# Patient Record
Sex: Male | Born: 1960 | Race: Black or African American | Hispanic: No | Smoking: Never smoker
Health system: Southern US, Community
[De-identification: ages and names within clinical notes are randomized; demographics above are authoritative.]

## PROBLEM LIST (undated history)

## (undated) DIAGNOSIS — E785 Hyperlipidemia, unspecified: Secondary | ICD-10-CM

## (undated) DIAGNOSIS — H409 Unspecified glaucoma: Secondary | ICD-10-CM

## (undated) DIAGNOSIS — I10 Essential (primary) hypertension: Secondary | ICD-10-CM

## (undated) DIAGNOSIS — Z86010 Personal history of colonic polyps: Secondary | ICD-10-CM

## (undated) DIAGNOSIS — E119 Type 2 diabetes mellitus without complications: Secondary | ICD-10-CM

## (undated) DIAGNOSIS — E669 Obesity, unspecified: Secondary | ICD-10-CM

## (undated) DIAGNOSIS — Z860101 Personal history of adenomatous and serrated colon polyps: Secondary | ICD-10-CM

## (undated) HISTORY — DX: Obesity, unspecified: E66.9

## (undated) HISTORY — DX: Personal history of adenomatous and serrated colon polyps: Z86.0101

## (undated) HISTORY — PX: LUMBAR DISC SURGERY: SHX700

## (undated) HISTORY — DX: Hyperlipidemia, unspecified: E78.5

## (undated) HISTORY — DX: Personal history of colonic polyps: Z86.010

## (undated) HISTORY — DX: Type 2 diabetes mellitus without complications: E11.9

## (undated) HISTORY — PX: COLONOSCOPY: SHX174

## (undated) HISTORY — DX: Essential (primary) hypertension: I10

## (undated) HISTORY — DX: Unspecified glaucoma: H40.9

---

## 2009-02-25 ENCOUNTER — Emergency Department: Payer: Self-pay | Admitting: Emergency Medicine

## 2009-03-09 ENCOUNTER — Encounter: Admission: RE | Admit: 2009-03-09 | Discharge: 2009-03-09 | Payer: Self-pay | Admitting: Orthopedic Surgery

## 2009-03-18 ENCOUNTER — Ambulatory Visit (HOSPITAL_COMMUNITY): Admission: RE | Admit: 2009-03-18 | Discharge: 2009-03-19 | Payer: Self-pay | Admitting: Neurosurgery

## 2010-01-07 ENCOUNTER — Ambulatory Visit: Payer: Self-pay | Admitting: Internal Medicine

## 2010-02-02 ENCOUNTER — Inpatient Hospital Stay: Payer: Self-pay

## 2010-02-07 ENCOUNTER — Other Ambulatory Visit: Payer: Self-pay | Admitting: Internal Medicine

## 2010-02-12 ENCOUNTER — Ambulatory Visit: Payer: Self-pay | Admitting: Internal Medicine

## 2010-02-28 ENCOUNTER — Encounter: Payer: Self-pay | Admitting: Orthopedic Surgery

## 2010-03-01 ENCOUNTER — Ambulatory Visit: Payer: Self-pay

## 2010-03-10 ENCOUNTER — Ambulatory Visit: Payer: Self-pay | Admitting: Internal Medicine

## 2010-03-10 ENCOUNTER — Ambulatory Visit: Payer: Self-pay

## 2010-04-08 ENCOUNTER — Ambulatory Visit: Payer: Self-pay

## 2010-04-29 LAB — BASIC METABOLIC PANEL
BUN: 7 mg/dL (ref 6–23)
CO2: 31 mEq/L (ref 19–32)
Calcium: 9.7 mg/dL (ref 8.4–10.5)
Creatinine, Ser: 0.9 mg/dL (ref 0.4–1.5)
GFR calc non Af Amer: >60 mL/min (ref >60)
Glucose, Bld: 147 mg/dL — ABNORMAL HIGH (ref 70–99)

## 2010-04-29 LAB — URINALYSIS, ROUTINE W REFLEX MICROSCOPIC
Hgb urine dipstick: NEGATIVE
Nitrite: NEGATIVE
Protein, ur: NEGATIVE mg/dL
Urobilinogen, UA: 0.2 mg/dL (ref 0.0–1.0)

## 2010-04-29 LAB — CBC
Hemoglobin: 17.1 g/dL — ABNORMAL HIGH (ref 13.0–17.0)
MCHC: 34.9 g/dL (ref 30.0–36.0)
Platelets: 124 10*3/uL — ABNORMAL LOW (ref 150–400)
RDW: 13.6 % (ref 11.5–15.5)

## 2010-04-29 LAB — URINE MICROSCOPIC-ADD ON

## 2010-05-09 ENCOUNTER — Ambulatory Visit: Payer: Self-pay

## 2011-01-11 IMAGING — CR DG CHEST 1V PORT
1 series · 1 of 1 positions shown · non-contrast
Comparison: none

REASON FOR EXAM: weak DKA
COMMENTS:

PROCEDURE:     DXR - DXR PORTABLE CHEST SINGLE VIEW  - February 01, 2010 [DATE]
RESULT:     The lung fields are clear. No pneumonia, pneumothorax or pleural
effusion is seen. The heart size is normal.

[view not recorded]
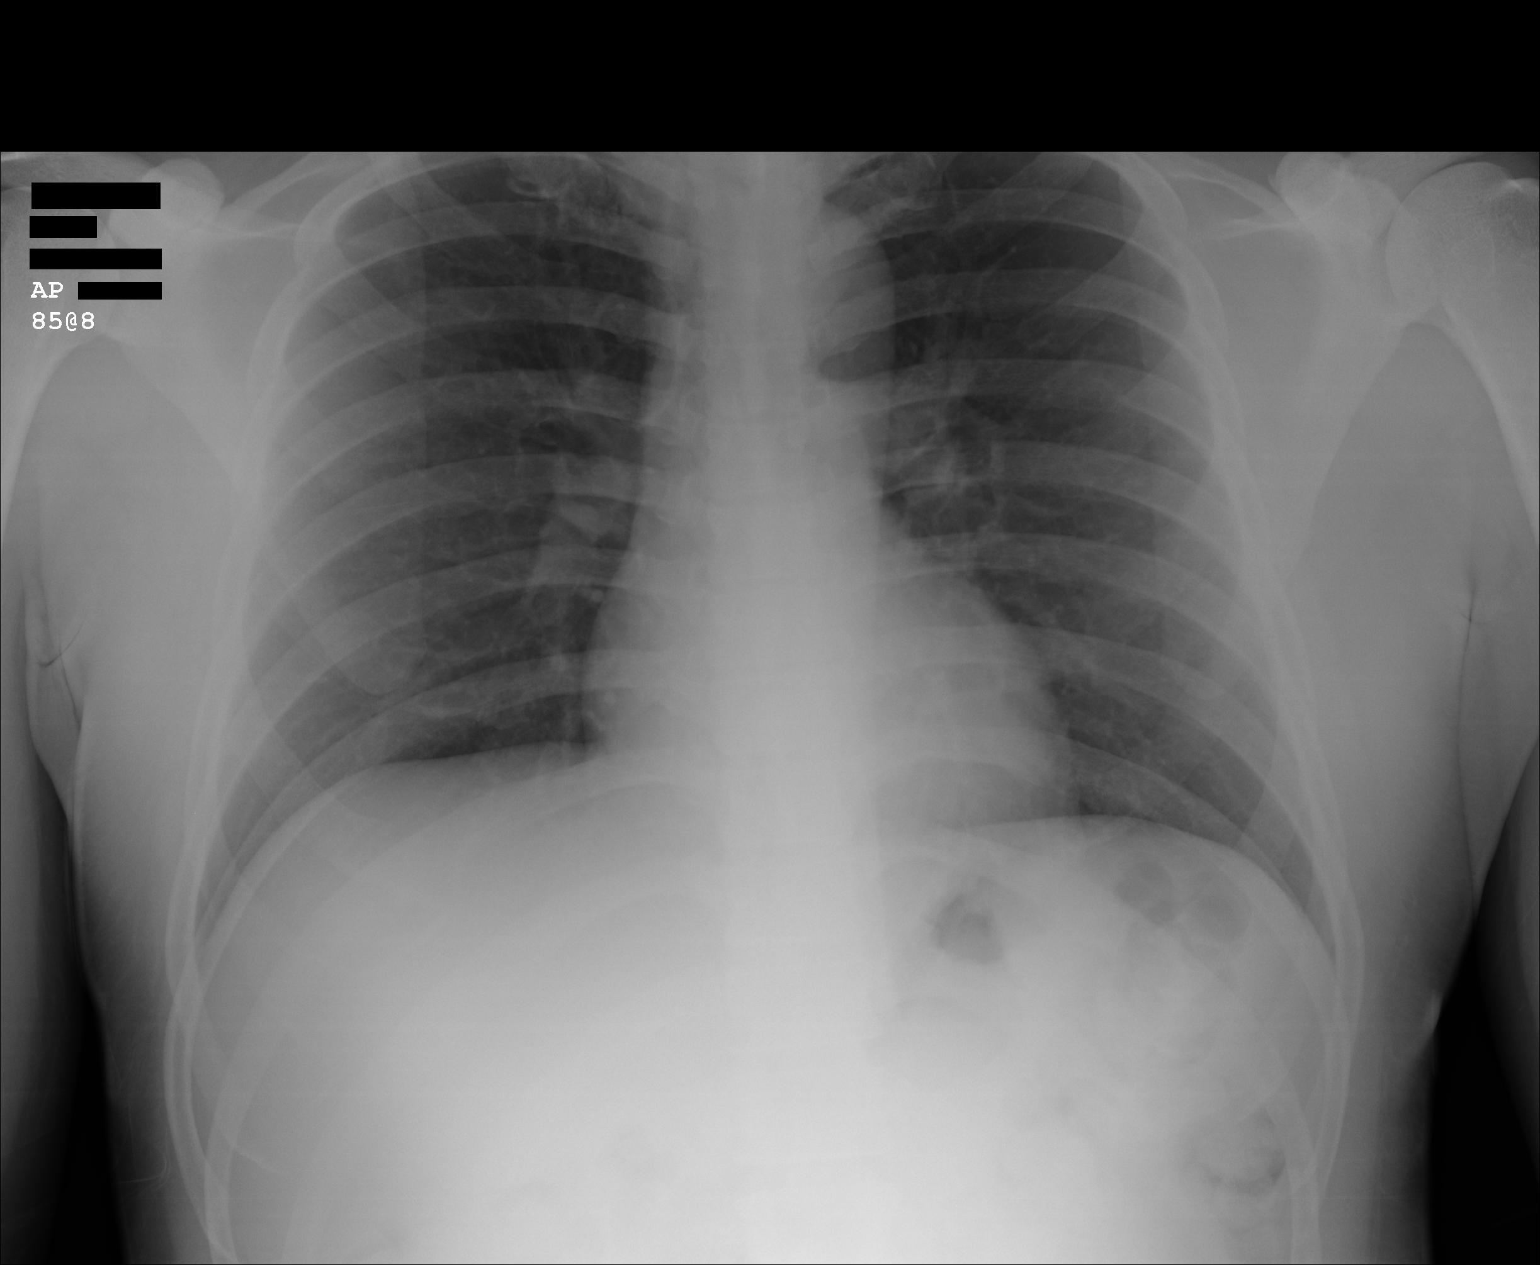

[1 of 1 positions shown; findings below may reference images not displayed]

IMPRESSION: No acute changes are identified.

## 2012-10-19 ENCOUNTER — Ambulatory Visit: Payer: Self-pay | Admitting: Gastroenterology

## 2012-11-06 ENCOUNTER — Ambulatory Visit: Payer: Self-pay | Admitting: Surgery

## 2012-11-06 LAB — CBC WITH DIFFERENTIAL/PLATELET
Basophil %: 0.9 %
Eosinophil #: 0.2 10*3/uL (ref 0.0–0.7)
HCT: 44.8 % (ref 40.0–52.0)
Lymphocyte #: 2.2 10*3/uL (ref 1.0–3.6)
MCHC: 34.5 g/dL (ref 32.0–36.0)
MCV: 88 fL (ref 80–100)
Neutrophil %: 36.7 %
Platelet: 114 10*3/uL — ABNORMAL LOW (ref 150–440)
RBC: 5.11 10*6/uL (ref 4.40–5.90)
RDW: 14.2 % (ref 11.5–14.5)

## 2012-11-13 ENCOUNTER — Inpatient Hospital Stay: Payer: Self-pay | Admitting: Surgery

## 2012-11-14 LAB — PLATELET COUNT: Platelet: 118 10*3/uL — ABNORMAL LOW (ref 150–440)

## 2012-11-15 LAB — PATHOLOGY REPORT

## 2013-08-22 DIAGNOSIS — D696 Thrombocytopenia, unspecified: Secondary | ICD-10-CM | POA: Insufficient documentation

## 2014-05-30 NOTE — Discharge Summary (Signed)
PATIENT NAME:  Matthew Chase, Matthew Chase MR#:  [id number] DATE OF BIRTH:  1960/06/18  DATE OF ADMISSION:  11/13/2012 DATE OF DISCHARGE:  11/17/2012  HISTORY OF PRESENT ILLNESS: This 54 year old male with admitted electively after a bowel preparation.  He had recent colonoscopy findings of three polyps in the cecum and ascending colon. One polyp was removed, and was a tubulovillous adenoma. 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 The patient also had findings of an umbilical hernia.   The patient did have bowel preparation at home, was brought in through the outpatient surgery department for laparoscopic right colectomy and umbilical hernia repair.   PAST MEDICAL HISTORY: Includes diabetes, glaucoma, obesity, hyperlipidemia.   He did have a preop prophylactic antibiotic and also postoperatively was treated with subcutaneous heparin. He was treated with IV fluids, analgesics, and soon began a clear liquid diet and gradually advanced to full liquids and to solid food. Did demonstrate bowel function prior to discharge, and his incisions progressed satisfactorily.   Pathology demonstrated a tubulovillous adenoma of the cecum and another tubulovillous adenoma of the ascending colon.   FINAL DIAGNOSIS: Right colonic polyps, umbilical hernia.   PROCEDURE: Laparoscopic right colectomy and open umbilical hernia repair.   Wound care instructions given and plans were made for follow-up visit in the office.  ____________________________ J. Rochel Brome, MD jws:cg D: 12/02/2012 21:08:35 ET T: 12/03/2012 02:50:18 ET JOB#: [id number]  cc: Loreli Dollar, MD, <Dictator> Loreli Dollar MD ELECTRONICALLY SIGNED 12/07/2012 18:26

## 2014-05-30 NOTE — Op Note (Signed)
PATIENT NAME:  Matthew Chase, Matthew Chase MR#:  [id number] DATE OF BIRTH:  10/12/1960  PREOPERATIVE DIAGNOSES: Three polyps of the right colon, umbilical hernia.   POSTOPERATIVE DIAGNOSES: Three polyps of the right colon, umbilical hernia.   PROCEDURE: Laparoscopic right colectomy, open umbilical hernia repair.   SURGEON: Rochel Brome, M.D.   ANESTHESIA: General.   INDICATIONS: This 54 year old male recently had colonoscopy with a finding of 3 right colonic polyps including one with findings of tubulovillous adenoma with high-grade dysplasia. Also has a longer-standing history of bulging at the navel, requesting repair.   DESCRIPTION OF PROCEDURE: The patient was placed on the operating table in the supine position under general endotracheal anesthesia. The abdomen was clipped and prepared with ChloraPrep and draped in a sterile manner.   A short incision was made below the umbilicus, some 12 mm in length, carried down through subcutaneous tissues to encounter the deep fascia which was grasped with laryngeal hook and elevated. A Veress needle was inserted, aspirated, and irrigated with a saline solution. Next, the peritoneal cavity was inflated with carbon dioxide. The Veress needle was removed. The 10 mm cannula was inserted. The 10 mm 0-degree laparoscope was inserted to view the peritoneal cavity. The liver appeared normal. Right colon was noted. There were some adhesions between the omentum and the umbilicus. It appeared that likely there was omentum which was incarcerated in the umbilicus.   Another incision was made in the epigastrium to insert an 11 mm cannula, another incision in the right lower quadrant to introduce a 5 mm cannula, and another incision in the right upper quadrant to introduce an 11 mm cannula.   The patient was tilted towards the left. The cecum was identified. The right colon was mobilized with incision of the lateral peritoneal reflection using the Harmonic scalpel. The terminal  ileum and appendix were mobilized as well. Subsequently, dissection was carried out, mobilizing the right transverse colon and dissecting a portion of the omentum off the colon and dissecting from medial to lateral using the Harmonic scalpel, mobilizing the right transverse colon and hepatic flexure.   Further dissection was carried out along the cecum and the ascending colon from further mobilization and identified the duodenum as the mesentery was dissected off the duodenum, and after satisfactory mobilization of the colon, I could see that hemostasis was intact and the laparoscopic instruments were then removed.   Next, an incision was made to remove the colon, oriented longitudinally in the midline, carried around just to the left of the umbilicus. This incision was lengthened several times due to muscle mass and thickness of the abdominal wall to provide adequate exposure to the mesentery. The right colon was palpated. I could detect the presence of polyps within the colon although there was no hard mass. There was no tumor seen growing through the colon wall and there were no hard, enlarged lymph nodes.   A window was created in the transverse mesocolon right of the site of the middle colic vessels and the omentum was divided at this site with the Harmonic scalpel. Mesenteric dissection was carried out with the Harmonic scalpel. Also created a window in the mesentery of the terminal ileum some 4 inches proximal to the ileocecal valve and began the dissection there with the Harmonic scalpel. It is noted that the last portion to be dissected were the ileocolic vessels which were suture ligated with 0 chromic and divided with the Harmonic scalpel.   Next, the small bowel was placed beside the transverse  colon at the intended site of anastomosis. The antimesenteric border of the small bowel and large bowel were elevated with Allis clamps. An enterotomy was made and a colotomy made and introduced the GIA 75  mm stapler which was engaged along the antimesenteric borders to begin the anastomosis. Staple lines were hemostatic. The anastomosis was completed with application of the BJ-62 stapler which was placed perpendicular to the first, engaged, and activated, and the specimen resected and submitted in formalin for routine pathology. This did include the terminal ileum, appendix, and right colon. The anastomosis was inspected. Several small bleeding points were cauterized. One bleeding point was suture ligated with 5-0 Vicryl. The mesenteric defect was closed with running 3-0 chromic.   Gown and gloves, instruments, and towels were changed.   Next, the wound was further inspected and hemostasis was intact. A pull suction was placed along the right colic gutter and there was no blood found.   Next, the umbilical hernia repair was carried out. There was a somewhat large umbilical hernia with a defect of approximately 2 cm and the sac was dissected free from surrounding structures and was excised. It did not need to be sent for pathology. The fascial ring defect was delineated and exposed. Next, the repair was begun with 0 Monocryl figure-of-eight sutures with a transversely oriented suture line. It was also noted that the skin of the umbilicus was sutured to the deep fascia with 5-0 Vicryl. It is noted that repair the umbilical hernia added approximately 40 minutes to the operation.   Next, the midline fascia was closed with 0 Maxon interrupted figure-of-eight sutures. It appeared that there was another small ventral hernia defect which was left in the epigastric area, and there was some properitoneal fat dissected away, and repair was incorporated into the fascial closure.   It is further noted that the 2 remaining laparoscopic port sites were inspected. There was some oozing from the right upper quadrant port site which several bleeding points were cauterized and also was infiltrated with 0.5% Sensorcaine with  epinephrine.   All wounds were closed with clips. Dressings were applied with paper tape. The patient tolerated surgery satisfactorily and was then prepared for transfer to the recovery room.  ____________________________ Lenna Sciara. Rochel Brome, MD jws:np D: 11/13/2012 14:27:00 ET T: 11/13/2012 16:11:42 ET JOB#: [id number]  cc: Loreli Dollar, MD, <Dictator> Loreli Dollar MD ELECTRONICALLY SIGNED 11/14/2012 18:19

## 2018-02-15 DIAGNOSIS — I10 Essential (primary) hypertension: Secondary | ICD-10-CM | POA: Diagnosis not present

## 2018-02-15 DIAGNOSIS — Z23 Encounter for immunization: Secondary | ICD-10-CM | POA: Diagnosis not present

## 2018-02-15 DIAGNOSIS — E119 Type 2 diabetes mellitus without complications: Secondary | ICD-10-CM | POA: Diagnosis not present

## 2018-03-20 DIAGNOSIS — E119 Type 2 diabetes mellitus without complications: Secondary | ICD-10-CM | POA: Diagnosis not present

## 2018-03-20 DIAGNOSIS — H43811 Vitreous degeneration, right eye: Secondary | ICD-10-CM | POA: Diagnosis not present

## 2018-03-20 DIAGNOSIS — H401132 Primary open-angle glaucoma, bilateral, moderate stage: Secondary | ICD-10-CM | POA: Diagnosis not present

## 2018-04-24 DIAGNOSIS — H401132 Primary open-angle glaucoma, bilateral, moderate stage: Secondary | ICD-10-CM | POA: Diagnosis not present

## 2018-04-24 DIAGNOSIS — H53432 Sector or arcuate defects, left eye: Secondary | ICD-10-CM | POA: Diagnosis not present

## 2018-04-24 DIAGNOSIS — H53431 Sector or arcuate defects, right eye: Secondary | ICD-10-CM | POA: Diagnosis not present

## 2018-05-03 DIAGNOSIS — E119 Type 2 diabetes mellitus without complications: Secondary | ICD-10-CM | POA: Diagnosis not present

## 2018-05-03 DIAGNOSIS — Z719 Counseling, unspecified: Secondary | ICD-10-CM | POA: Diagnosis not present

## 2018-05-03 DIAGNOSIS — I10 Essential (primary) hypertension: Secondary | ICD-10-CM | POA: Diagnosis not present

## 2018-06-05 DIAGNOSIS — M549 Dorsalgia, unspecified: Secondary | ICD-10-CM | POA: Diagnosis not present

## 2018-06-15 DIAGNOSIS — E119 Type 2 diabetes mellitus without complications: Secondary | ICD-10-CM | POA: Diagnosis not present

## 2018-06-15 DIAGNOSIS — I10 Essential (primary) hypertension: Secondary | ICD-10-CM | POA: Diagnosis not present

## 2018-06-18 DIAGNOSIS — H409 Unspecified glaucoma: Secondary | ICD-10-CM | POA: Diagnosis not present

## 2018-06-18 DIAGNOSIS — I10 Essential (primary) hypertension: Secondary | ICD-10-CM | POA: Diagnosis not present

## 2018-06-18 DIAGNOSIS — E119 Type 2 diabetes mellitus without complications: Secondary | ICD-10-CM | POA: Diagnosis not present

## 2018-07-16 DIAGNOSIS — E119 Type 2 diabetes mellitus without complications: Secondary | ICD-10-CM | POA: Diagnosis not present

## 2018-07-16 DIAGNOSIS — I10 Essential (primary) hypertension: Secondary | ICD-10-CM | POA: Diagnosis not present

## 2018-07-27 DIAGNOSIS — E119 Type 2 diabetes mellitus without complications: Secondary | ICD-10-CM | POA: Diagnosis not present

## 2018-07-27 DIAGNOSIS — I10 Essential (primary) hypertension: Secondary | ICD-10-CM | POA: Diagnosis not present

## 2018-08-01 ENCOUNTER — Other Ambulatory Visit: Payer: Self-pay | Admitting: Internal Medicine

## 2018-08-01 DIAGNOSIS — Z20822 Contact with and (suspected) exposure to covid-19: Secondary | ICD-10-CM

## 2018-08-06 LAB — NOVEL CORONAVIRUS, NAA: SARS-CoV-2, NAA: NOT DETECTED

## 2018-09-06 DIAGNOSIS — H401132 Primary open-angle glaucoma, bilateral, moderate stage: Secondary | ICD-10-CM | POA: Diagnosis not present

## 2018-09-06 DIAGNOSIS — H53431 Sector or arcuate defects, right eye: Secondary | ICD-10-CM | POA: Diagnosis not present

## 2018-09-19 DIAGNOSIS — Z1159 Encounter for screening for other viral diseases: Secondary | ICD-10-CM | POA: Diagnosis not present

## 2018-09-19 DIAGNOSIS — Z125 Encounter for screening for malignant neoplasm of prostate: Secondary | ICD-10-CM | POA: Diagnosis not present

## 2018-09-19 DIAGNOSIS — E119 Type 2 diabetes mellitus without complications: Secondary | ICD-10-CM | POA: Diagnosis not present

## 2018-09-19 DIAGNOSIS — Z0000 Encounter for general adult medical examination without abnormal findings: Secondary | ICD-10-CM | POA: Diagnosis not present

## 2018-09-19 DIAGNOSIS — I10 Essential (primary) hypertension: Secondary | ICD-10-CM | POA: Diagnosis not present

## 2018-09-21 DIAGNOSIS — E119 Type 2 diabetes mellitus without complications: Secondary | ICD-10-CM | POA: Diagnosis not present

## 2018-09-21 DIAGNOSIS — I10 Essential (primary) hypertension: Secondary | ICD-10-CM | POA: Diagnosis not present

## 2018-09-25 DIAGNOSIS — Z6832 Body mass index (BMI) 32.0-32.9, adult: Secondary | ICD-10-CM | POA: Diagnosis not present

## 2018-09-25 DIAGNOSIS — Z1211 Encounter for screening for malignant neoplasm of colon: Secondary | ICD-10-CM | POA: Diagnosis not present

## 2018-09-25 DIAGNOSIS — Z0000 Encounter for general adult medical examination without abnormal findings: Secondary | ICD-10-CM | POA: Diagnosis not present

## 2018-12-19 DIAGNOSIS — E119 Type 2 diabetes mellitus without complications: Secondary | ICD-10-CM | POA: Diagnosis not present

## 2018-12-19 DIAGNOSIS — I10 Essential (primary) hypertension: Secondary | ICD-10-CM | POA: Diagnosis not present

## 2018-12-21 DIAGNOSIS — E119 Type 2 diabetes mellitus without complications: Secondary | ICD-10-CM | POA: Diagnosis not present

## 2018-12-21 DIAGNOSIS — E663 Overweight: Secondary | ICD-10-CM | POA: Diagnosis not present

## 2018-12-21 DIAGNOSIS — I10 Essential (primary) hypertension: Secondary | ICD-10-CM | POA: Diagnosis not present

## 2018-12-21 DIAGNOSIS — Z6831 Body mass index (BMI) 31.0-31.9, adult: Secondary | ICD-10-CM | POA: Diagnosis not present

## 2018-12-25 DIAGNOSIS — Z23 Encounter for immunization: Secondary | ICD-10-CM | POA: Diagnosis not present

## 2019-01-01 DIAGNOSIS — H2513 Age-related nuclear cataract, bilateral: Secondary | ICD-10-CM | POA: Diagnosis not present

## 2019-01-01 DIAGNOSIS — H401132 Primary open-angle glaucoma, bilateral, moderate stage: Secondary | ICD-10-CM | POA: Diagnosis not present

## 2019-01-01 DIAGNOSIS — H5213 Myopia, bilateral: Secondary | ICD-10-CM | POA: Diagnosis not present

## 2019-01-01 DIAGNOSIS — E119 Type 2 diabetes mellitus without complications: Secondary | ICD-10-CM | POA: Diagnosis not present

## 2019-04-24 DIAGNOSIS — E119 Type 2 diabetes mellitus without complications: Secondary | ICD-10-CM | POA: Diagnosis not present

## 2019-04-24 DIAGNOSIS — I10 Essential (primary) hypertension: Secondary | ICD-10-CM | POA: Diagnosis not present

## 2019-04-26 DIAGNOSIS — E119 Type 2 diabetes mellitus without complications: Secondary | ICD-10-CM | POA: Diagnosis not present

## 2019-04-26 DIAGNOSIS — I10 Essential (primary) hypertension: Secondary | ICD-10-CM | POA: Diagnosis not present

## 2019-04-26 DIAGNOSIS — E663 Overweight: Secondary | ICD-10-CM | POA: Diagnosis not present

## 2019-05-02 DIAGNOSIS — H401132 Primary open-angle glaucoma, bilateral, moderate stage: Secondary | ICD-10-CM | POA: Diagnosis not present

## 2019-05-02 DIAGNOSIS — H2513 Age-related nuclear cataract, bilateral: Secondary | ICD-10-CM | POA: Diagnosis not present

## 2019-05-02 DIAGNOSIS — E119 Type 2 diabetes mellitus without complications: Secondary | ICD-10-CM | POA: Diagnosis not present

## 2019-07-24 DIAGNOSIS — I10 Essential (primary) hypertension: Secondary | ICD-10-CM | POA: Diagnosis not present

## 2019-07-24 DIAGNOSIS — E119 Type 2 diabetes mellitus without complications: Secondary | ICD-10-CM | POA: Diagnosis not present

## 2019-07-26 DIAGNOSIS — E782 Mixed hyperlipidemia: Secondary | ICD-10-CM | POA: Diagnosis not present

## 2019-07-26 DIAGNOSIS — E1165 Type 2 diabetes mellitus with hyperglycemia: Secondary | ICD-10-CM | POA: Diagnosis not present

## 2019-07-26 DIAGNOSIS — I10 Essential (primary) hypertension: Secondary | ICD-10-CM | POA: Diagnosis not present

## 2019-08-26 DIAGNOSIS — H43811 Vitreous degeneration, right eye: Secondary | ICD-10-CM | POA: Diagnosis not present

## 2019-08-26 DIAGNOSIS — H53431 Sector or arcuate defects, right eye: Secondary | ICD-10-CM | POA: Diagnosis not present

## 2019-08-26 DIAGNOSIS — H401132 Primary open-angle glaucoma, bilateral, moderate stage: Secondary | ICD-10-CM | POA: Diagnosis not present

## 2019-08-26 DIAGNOSIS — H53432 Sector or arcuate defects, left eye: Secondary | ICD-10-CM | POA: Diagnosis not present

## 2019-10-23 DIAGNOSIS — Z1159 Encounter for screening for other viral diseases: Secondary | ICD-10-CM | POA: Diagnosis not present

## 2019-10-23 DIAGNOSIS — Z0000 Encounter for general adult medical examination without abnormal findings: Secondary | ICD-10-CM | POA: Diagnosis not present

## 2019-10-25 DIAGNOSIS — I10 Essential (primary) hypertension: Secondary | ICD-10-CM | POA: Diagnosis not present

## 2019-10-25 DIAGNOSIS — E782 Mixed hyperlipidemia: Secondary | ICD-10-CM | POA: Diagnosis not present

## 2019-10-25 DIAGNOSIS — E1165 Type 2 diabetes mellitus with hyperglycemia: Secondary | ICD-10-CM | POA: Diagnosis not present

## 2019-10-29 DIAGNOSIS — E1165 Type 2 diabetes mellitus with hyperglycemia: Secondary | ICD-10-CM | POA: Diagnosis not present

## 2019-10-29 DIAGNOSIS — I10 Essential (primary) hypertension: Secondary | ICD-10-CM | POA: Diagnosis not present

## 2019-10-29 DIAGNOSIS — Z0000 Encounter for general adult medical examination without abnormal findings: Secondary | ICD-10-CM | POA: Diagnosis not present

## 2019-10-29 DIAGNOSIS — R739 Hyperglycemia, unspecified: Secondary | ICD-10-CM | POA: Diagnosis not present

## 2019-10-29 DIAGNOSIS — Z1211 Encounter for screening for malignant neoplasm of colon: Secondary | ICD-10-CM | POA: Diagnosis not present

## 2019-10-29 DIAGNOSIS — Z23 Encounter for immunization: Secondary | ICD-10-CM | POA: Diagnosis not present

## 2021-02-18 ENCOUNTER — Other Ambulatory Visit: Payer: Self-pay

## 2021-02-18 ENCOUNTER — Encounter: Payer: Self-pay | Admitting: Internal Medicine

## 2021-02-18 ENCOUNTER — Ambulatory Visit: Payer: 59 | Admitting: Internal Medicine

## 2021-02-18 VITALS — BP 116/78 | HR 71 | Temp 97.5°F | Resp 24 | Ht 65.0 in | Wt 181.3 lb

## 2021-02-18 DIAGNOSIS — H4010X0 Unspecified open-angle glaucoma, stage unspecified: Secondary | ICD-10-CM

## 2021-02-18 DIAGNOSIS — E119 Type 2 diabetes mellitus without complications: Secondary | ICD-10-CM | POA: Insufficient documentation

## 2021-02-18 DIAGNOSIS — Z0000 Encounter for general adult medical examination without abnormal findings: Secondary | ICD-10-CM

## 2021-02-18 DIAGNOSIS — I10 Essential (primary) hypertension: Secondary | ICD-10-CM

## 2021-02-18 DIAGNOSIS — E785 Hyperlipidemia, unspecified: Secondary | ICD-10-CM

## 2021-02-18 DIAGNOSIS — M545 Low back pain, unspecified: Secondary | ICD-10-CM | POA: Insufficient documentation

## 2021-02-18 DIAGNOSIS — E669 Obesity, unspecified: Secondary | ICD-10-CM | POA: Insufficient documentation

## 2021-02-18 DIAGNOSIS — Z86010 Personal history of colonic polyps: Secondary | ICD-10-CM | POA: Insufficient documentation

## 2021-02-18 DIAGNOSIS — Z23 Encounter for immunization: Secondary | ICD-10-CM

## 2021-02-18 LAB — POCT GLYCOSYLATED HEMOGLOBIN (HGB A1C): Hemoglobin A1C: 6.5 % — AB (ref 4.0–5.6)

## 2021-02-18 LAB — GLUCOSE, CAPILLARY: Glucose-Capillary: 139 mg/dL — ABNORMAL HIGH (ref 70–99)

## 2021-02-18 MED ORDER — ROSUVASTATIN CALCIUM 10 MG PO TABS: ORAL_TABLET | ORAL | 2 refills | Status: DC

## 2021-02-18 MED ORDER — LISINOPRIL 2.5 MG PO TABS: 2.5000 mg | ORAL_TABLET | Freq: Every day | ORAL | 2 refills | Status: DC

## 2021-02-18 MED ORDER — EMPAGLIFLOZIN 25 MG PO TABS: ORAL_TABLET | ORAL | 2 refills | Status: DC

## 2021-02-18 NOTE — Assessment & Plan Note (Addendum)
Patient presents to establish care.  Blood pressure today 116/78.  Patient currently on lisinopril 2.5 mg once per day.  He endorses adherence.  Denies symptoms of hypotension.  Blood pressures currently well controlled on current regimen. Last BMP per care everywhere, September 2022 with unremarkable BMP.  Plan: -Continue lisinopril 2.5 mg once per day

## 2021-02-18 NOTE — Patient Instructions (Signed)
Thank you, Mr.Jailon Wherry for allowing [place] to provide your care today. Today we discussed:  Type 2 diabetes mellitus: Continue taking your metformin and Jardiance as previously prescribed.  Your hemoglobin A1c looks great today at 6.5%.  Continue the dietary changes and walking that you have already been doing.  I have referred you for a colonoscopy.  You should receive a call from the gastroenterologist office to schedule.  I have refilled the following medications: Lisinopril, Crestor, Jardiance  I have ordered the following labs for you:   Lab Orders         Glucose, capillary         POC Hbg A1C      Please follow-up in 6 months.  If you need to be seen sooner you can call and set up an earlier appointment.  You can call our office with any updates at any time.  Please make sure to arrive 15 minutes prior to your next appointment. If you arrive late, you may be asked to reschedule.    We look forward to seeing you next time. Please call our clinic at [phone] if you have any questions or concerns. The best time to call is Monday-Friday from 9am-4pm, but there is someone available 24/7. If after hours or the weekend, call the main hospital number and ask for the Internal Medicine Resident On-Call. If you need medication refills, please notify your pharmacy one week in advance and they will send [place] a request.   Thank you for letting us take part in your care. Wishing you the best!  Wayland Denis, MD 02/18/2021, 9:52 AM IM Resident, PGY-1

## 2021-02-18 NOTE — Progress Notes (Signed)
°  CC: establish care  HPI:  [name] is a 61 y.o. male with a African-American male past medical history stated below and presents today to establish care. Please see problem based assessment and plan for additional details.  Past Medical History:  Diagnosis Date   Glaucoma    History of adenomatous polyp of colon    Hyperlipidemia    Hypertension    Obesity (BMI 30-39.9)    Type 2 diabetes mellitus ([place])     Current Outpatient Medications on File Prior to Visit  Medication Sig Dispense Refill   Brinzolamide-Brimonidine 1-0.2 % SUSP INSTILL 1 DROP IN EACH EYE TWICE A DAY     empagliflozin (JARDIANCE) 25 MG TABS tablet Take 0.5 tablets by mouth every morning.     glucose blood test strip Use as directed. Check CBG's up to 4x/daily. Dx: E10.9     insulin detemir (LEVEMIR) 100 UNIT/ML FlexPen Inject 22 units subcutaneously at night     insulin lispro (HUMALOG) 100 UNIT/ML KwikPen Inject 6 units subcutaneously 3 times a day before meals     Insulin Pen Needle (BD PEN NEEDLE NANO U/F) 32G X 4 MM MISC 4 (four) times daily.     latanoprost (XALATAN) 0.005 % ophthalmic solution INSTILL 1 DROP IN BOTH EYES AT BEDTIME     lisinopril (ZESTRIL) 2.5 MG tablet Take 1 tablet by mouth daily.     metFORMIN (GLUCOPHAGE) 1000 MG tablet TAKE ONE TABLET BY MOUTH TWICE A DAY FOR DIABETES (ANNUAL KIDNEY FUNCTION TESTING IS NEEDED)     Multiple Vitamin (MULTIVITAMINS PO) Take 1 tablet by mouth daily.     rosuvastatin (CRESTOR) 10 MG tablet TAKE ONE-HALF TABLET BY MOUTH ONCE A DAY FOR CHOLESTEROL     aspirin 81 MG EC tablet Take by mouth.     No current facility-administered medications on file prior to visit.    No family history on file.  Social History: Matthew Chase, lives alone, retired worked 69 years for [employer], [employer], works more with church.   Review of Systems: ROS negative except for what is noted on the assessment and plan.  Vitals:   02/18/21 0844  BP: 116/78  Pulse: 71   Resp: (!) 24  Temp: (!) 97.5 F (36.4 C)  TempSrc: Oral  SpO2: 100%  Weight: 181 lb 4.8 oz (82.2 kg)  Height: 5\' 5"  (1.651 m)     Physical Exam: General: Well appearing African-American male, NAD HENT: normocephalic, atraumatic EYES: conjunctiva non-erythematous, no scleral icterus CV: regular rate, normal rhythm, no murmurs, rubs, gallops. Pulmonary: normal work of breathing on RA, lungs clear to auscultation, no rales, wheezes, rhonchi Abdominal: non-distended, soft, non-tender to palpation, normal BS Skin: Warm and dry, no rashes or lesions Neurological: MS: awake, alert and oriented x3, normal speech and fund of knowledge Motor: moves all extremities antigravity Psych: normal affect  Foot exam documented in chart.  Assessment & Plan:   See Encounters Tab for problem based charting.  Patient discussed with Dr.  Solon Chase, M.D. [facility], PGY-1 Pager: [phone] Date 02/18/2021 Time 8:55 AM

## 2021-02-18 NOTE — Assessment & Plan Note (Signed)
Previously followed with Dr. Molinda Bailiff at [facility], last [place] 12/08/2020. Patient is currently on Simbrinza and latanoprost eyedrops.  Was noted to have bitemporal visual field deficits, and had MRI brain completed which showed 0.2 cm nonenhancing focus, microadenoma cannot be excluded.  Was referred back to patient's PCP at that time Dr. Domenica Fail for hormonal work-up.  Normal testing completed September 2022 with TSH, cortisol serum, testosterone, FSH, prolactin, LH, all within normal limits.  Will need to clarify at next OV if patient attended appointment with endocrinology, documentation of previous referral with scheduled appointment in January 2023.  Plan: -Patient reports is establishing care with [facility] and has appointment on 1/30 -[place] and latanoprost per ophthalmology

## 2021-02-18 NOTE — Assessment & Plan Note (Signed)
Patient presents to establish care in this OV.  Previously followed with [facility] and [place].  Had also seen a private family medicine office here in [place], Dr. Ernie Hew.  Prefers to establish care here at [facility].  Most recent hemoglobin A1c March 2022 7.7% per patient's paper documents which he brings in today.  Hemoglobin A1c 6.5%.   Patient denies polydipsia, polyuria.  Denies tingling numbness of extremities.  Follows with ophthalmology for glaucoma.  Used to see Dr. Molinda Bailiff at the [facility], is now establishing care with [facility] and has appointment 1/30.  Reports walking for exercise 4-5 times a week.  Eating less fried food, increasing vegetable intake, decreasing white starches, no sodas.  Checks blood sugars once in a while in the mornings, reports they are 120s-130s.  Acknowledges higher numbers when eating meal the night before that contains more carbohydrates.  Blood sugars currently well controlled on current regimen of metformin 1000 mg twice daily, Jardiance 12.5 mg once per day.  Foot exam completed today.  Is up-to-date with eye exams.  Plan: -Continue metformin 1000 mg twice daily -Continue Jardiance 12.5 mg once a day -Continue lifestyle modifications -Follow-up in 6 months for next hemoglobin A1c check, consider tapering off medications if hemoglobin A1c continues to improve

## 2021-02-18 NOTE — Assessment & Plan Note (Signed)
Patient brings lab work from office visit in March 2022. Currently on Crestor 5 mg once a day.   2019 lipid panel shows total cholesterol 148, triglyceride 109, HDL 43, LDL 83.  Plan: -Continue Crestor 5 mg once a day -Repeat lipid panel at next OV, calculate ASCVD score

## 2021-02-19 NOTE — Progress Notes (Signed)
Internal Medicine Clinic Attending ? ?Case discussed with Dr. Zinoviev  At the time of the visit.  We reviewed the resident?s history and exam and pertinent patient test results.  I agree with the assessment, diagnosis, and plan of care documented in the resident?s note.  ?

## 2021-03-08 ENCOUNTER — Other Ambulatory Visit: Payer: Self-pay | Admitting: Student

## 2021-03-08 DIAGNOSIS — Z86010 Personal history of colonic polyps: Secondary | ICD-10-CM

## 2021-03-08 LAB — HM DIABETES EYE EXAM

## 2021-03-15 ENCOUNTER — Other Ambulatory Visit: Payer: Self-pay | Admitting: Student

## 2021-03-15 DIAGNOSIS — Z0000 Encounter for general adult medical examination without abnormal findings: Secondary | ICD-10-CM

## 2021-03-15 DIAGNOSIS — E119 Type 2 diabetes mellitus without complications: Secondary | ICD-10-CM

## 2021-03-15 NOTE — Telephone Encounter (Signed)
Refill Request   metFORMIN (GLUCOPHAGE) 1000 MG tablet Multiple Vitamin (MULTIVITAMINS PO)  [place], [place] [address] (Ph: [phone])

## 2021-03-18 MED ORDER — MULTIVITAMINS PO CAPS: 1.0000 | ORAL_CAPSULE | Freq: Every day | ORAL | 2 refills | Status: AC

## 2021-03-18 MED ORDER — METFORMIN HCL 1000 MG PO TABS: ORAL_TABLET | ORAL | 2 refills | Status: DC

## 2021-05-07 ENCOUNTER — Encounter: Payer: Self-pay | Admitting: Student

## 2021-05-18 ENCOUNTER — Other Ambulatory Visit: Payer: Self-pay

## 2021-05-18 DIAGNOSIS — E119 Type 2 diabetes mellitus without complications: Secondary | ICD-10-CM

## 2021-05-18 MED ORDER — METFORMIN HCL 1000 MG PO TABS: ORAL_TABLET | ORAL | 2 refills | Status: DC

## 2021-08-12 ENCOUNTER — Encounter: Payer: Self-pay | Admitting: Student

## 2021-08-16 ENCOUNTER — Other Ambulatory Visit (INDEPENDENT_AMBULATORY_CARE_PROVIDER_SITE_OTHER): Payer: 59 | Admitting: Internal Medicine

## 2021-08-16 DIAGNOSIS — E119 Type 2 diabetes mellitus without complications: Secondary | ICD-10-CM

## 2021-08-16 MED ORDER — DAPAGLIFLOZIN PROPANEDIOL 5 MG PO TABS: 5.0000 mg | ORAL_TABLET | Freq: Every day | ORAL | 3 refills | Status: DC

## 2021-08-16 NOTE — Progress Notes (Signed)
Received message that patient's insurance co-pay is more affordable with farxiga vs jardiance.  -dc jardiance 12.5 mg -start farxiga 5 mg

## 2021-09-15 ENCOUNTER — Other Ambulatory Visit: Payer: Self-pay | Admitting: *Deleted

## 2021-09-15 DIAGNOSIS — E119 Type 2 diabetes mellitus without complications: Secondary | ICD-10-CM

## 2021-09-15 MED ORDER — METFORMIN HCL 1000 MG PO TABS: ORAL_TABLET | ORAL | 2 refills | Status: DC

## 2021-09-27 ENCOUNTER — Encounter: Payer: Self-pay | Admitting: Student

## 2021-09-28 MED ORDER — GLUCOSE BLOOD VI STRP: ORAL_STRIP | 12 refills | Status: DC

## 2021-10-05 ENCOUNTER — Encounter: Payer: Self-pay | Admitting: Student

## 2021-10-07 ENCOUNTER — Encounter: Payer: 59 | Admitting: Internal Medicine

## 2021-10-13 ENCOUNTER — Ambulatory Visit (INDEPENDENT_AMBULATORY_CARE_PROVIDER_SITE_OTHER): Payer: Commercial Managed Care - HMO | Admitting: Student

## 2021-10-13 ENCOUNTER — Encounter: Payer: Self-pay | Admitting: Student

## 2021-10-13 ENCOUNTER — Other Ambulatory Visit: Payer: Self-pay

## 2021-10-13 VITALS — BP 107/69 | HR 57 | Temp 98.1°F | Ht 65.0 in | Wt 173.5 lb

## 2021-10-13 DIAGNOSIS — R1031 Right lower quadrant pain: Secondary | ICD-10-CM

## 2021-10-13 DIAGNOSIS — E119 Type 2 diabetes mellitus without complications: Secondary | ICD-10-CM

## 2021-10-13 DIAGNOSIS — E785 Hyperlipidemia, unspecified: Secondary | ICD-10-CM

## 2021-10-13 DIAGNOSIS — I10 Essential (primary) hypertension: Secondary | ICD-10-CM | POA: Diagnosis not present

## 2021-10-13 DIAGNOSIS — Z7984 Long term (current) use of oral hypoglycemic drugs: Secondary | ICD-10-CM

## 2021-10-13 DIAGNOSIS — D696 Thrombocytopenia, unspecified: Secondary | ICD-10-CM

## 2021-10-13 DIAGNOSIS — R109 Unspecified abdominal pain: Secondary | ICD-10-CM | POA: Insufficient documentation

## 2021-10-13 LAB — GLUCOSE, CAPILLARY: Glucose-Capillary: 139 mg/dL — ABNORMAL HIGH (ref 70–99)

## 2021-10-13 LAB — POCT GLYCOSYLATED HEMOGLOBIN (HGB A1C): Hemoglobin A1C: 7.4 % — AB (ref 4.0–5.6)

## 2021-10-13 MED ORDER — XIGDUO XR 5-1000 MG PO TB24: 2.0000 | ORAL_TABLET | Freq: Every day | ORAL | 2 refills | Status: DC

## 2021-10-13 NOTE — Assessment & Plan Note (Signed)
Patient is taking 10 mg of rosuvastatin daily.  Last cholesterol panel was at an outside facility in 2019.  TC 148.  HDL 43.  LDL 83.  Repeat lipid panel today. - Follow-up lipid panel results.

## 2021-10-13 NOTE — Patient Instructions (Signed)
Thank you, [name] for allowing [place] to provide your care today. Today we discussed abdominal pain, diabetes, hypertension, and high cholesterol.    Based on your symptoms and physical exam, I don't think you abdominal pain is due to a worrisome cause. It is likely functional abdominal pain, which is a way of saying that it is normal discomfort caused by your bowels doing their job. If it becomes worse or if it is associated with nausea, vomiting, loss of appetite, blood in stool, fevers, or if it simply does not feel right to you, please call the office for a repeat evaluation.  For your diabetes, I will increase your Farxiga to 10 mg daily and I will combine it with your metformin into a single pill. The pill has 5 mg of Farxiga and 1000 mg of metformin. Please take one pill with breakfast and one pill with dinner. Return to the clinic in 3 months so we can check your hemoglobin A1c.  For your hypertension and cholesterol, we will check your electrolytes and blood cholesterol levels today. I will notify you of those results.  You're taking excellent care of yourself. Well done!  I have ordered the following labs for you:  Lab Orders         Lipid Profile         BMP8+Anion Gap         CBC no Diff         Glucose, capillary         POC Hbg A1C      Referrals ordered today:   Referral Orders  No referral(s) requested today     I have ordered the following medication/changed the following medications:   Stop the following medications: Medications Discontinued During This Encounter  Medication Reason   metFORMIN (GLUCOPHAGE) 1000 MG tablet Change in therapy   dapagliflozin propanediol (FARXIGA) 5 MG TABS tablet Change in therapy     Start the following medications: Meds ordered this encounter  Medications   Dapagliflozin-metFORMIN HCl ER (XIGDUO XR) 06-998 MG TB24    Sig: Take 2 tablets by mouth daily.    Dispense:  60 tablet    Refill:  2     Follow up: 3 months   We  look forward to seeing you next time. Please call our clinic at [phone] if you have any questions or concerns. The best time to call is Monday-Friday from 9am-4pm, but there is someone available 24/7. If after hours or the weekend, call the main hospital number and ask for the Internal Medicine Resident On-Call. If you need medication refills, please notify your pharmacy one week in advance and they will send [place] a request.   Thank you for trusting me with your care. Wishing you the best!   Nani Gasser, MD [place]

## 2021-10-13 NOTE — Assessment & Plan Note (Signed)
Patient continues to take lisinopril 2.5 mg daily.  He is an avid walker, and walks a few miles every day.  He tries to eat a healthy diet that includes lots of fish and chicken, while avoiding sugary drinks like sodas and sweet tea.  Clinic blood pressure is 107/69.  Well-controlled hypertension.  I suspect this good control is largely due to lifestyle rather than the small dose of lisinopril this patient takes.  Since this patient has not had laboratory monitoring for some time I will collect a BMP today. - Follow-up BMP results - For now, continue lisinopril 2.5 mg daily.

## 2021-10-13 NOTE — Assessment & Plan Note (Signed)
Patient complains of intermittent, mild right lower quadrant abdominal pain.  He first noticed it a few weeks ago.  It bothers him every few days.  When it occurs it starts gradually.  It usually happens at night when he lays flat.  It does not prevent him from getting sleep.  It does not prevent him from his usual daily activities.  It does not have any triggers.  He denies nausea, vomiting, loss of appetite, diarrhea, constipation, blood in stool, fevers.  Physical exam is reassuring.  No tenderness to deep palpation of abdomen.  No organomegaly.  Leading suspicion is functional abdominal pain, perhaps secondary to treatment with metformin.  I am not suspicious for any acute or life-threatening cause of his abdominal pain like appendicitis, colitis, cholecystitis.  No further work-up is warranted at this time.  I informed patient of worrisome signs or symptoms like nausea, vomiting, loss of appetite, blood in stool, fevers, and to seek repeat evaluation if any of these occur.

## 2021-10-13 NOTE — Assessment & Plan Note (Addendum)
Type 2 diabetes without complications.  Has been on insulin in the past, but not currently treated with insulin.  Interestingly, patient was diagnosed with type 1 diabetes by his previous primary care provider at [facility].  However insulin therapy was discontinued after the patient lost a substantial amount of weight.  He remains adherent to healthy lifestyle.  However, he has a strong family history of diabetes.  Today, his hemoglobin A1c has risen to 7.4 from 6.57 months ago.  BMI remains stable 28.87 kg/m.  I discussed genetic contribution to the pathophysiology of this disease with the patient.  I recommended increasing his dapagliflozin from 5 to 10 mg daily.  He also takes 1000 mg metformin twice daily.  He is amenable to combining these medications into 1 pill. - Start empagliflozin-metformin 06-998 mg twice daily. - Follow-up in 3 months for repeat hemoglobin A1c.

## 2021-10-13 NOTE — Assessment & Plan Note (Addendum)
History of chronic, stable, mild thrombocytopenia.  No episodes of bleeding or easy bruising.  Present since at least 2011 and noted during several follow-up visits in this clinic and at outside clinics since then.  Uncertain etiology.  We will repeat a CBC today. - Follow-up CBC for platelet count.

## 2021-10-13 NOTE — Progress Notes (Signed)
Subjective:  CC: Abdominal pain  HPI:  [name] is a 61 y.o. male with a past medical history stated below and presents today for routine follow-up and abdominal pain. Please see problem based assessment and plan for additional details.  Past Medical History:  Diagnosis Date   Glaucoma    History of adenomatous polyp of colon    Hyperlipidemia    Hypertension    Obesity (BMI 30-39.9)    Type 2 diabetes mellitus ([place])     Current Outpatient Medications on File Prior to Visit  Medication Sig Dispense Refill   aspirin 81 MG EC tablet Take by mouth.     Brinzolamide-Brimonidine 1-0.2 % SUSP INSTILL 1 DROP IN [facility] EYE TWICE A DAY     glucose blood test strip Use as instructed 200 each 12   latanoprost (XALATAN) 0.005 % ophthalmic solution INSTILL 1 DROP IN BOTH EYES AT BEDTIME     lisinopril (ZESTRIL) 2.5 MG tablet Take 1 tablet (2.5 mg total) by mouth daily. 90 tablet 2   Multiple Vitamin (MULTIVITAMIN) capsule Take 1 capsule by mouth daily. 90 capsule 2   rosuvastatin (CRESTOR) 10 MG tablet TAKE ONE-HALF TABLET BY MOUTH ONCE A DAY FOR CHOLESTEROL 60 tablet 2   No current facility-administered medications on file prior to visit.    Family History  Problem Relation Age of Onset   Diabetes Father    Hypertension Father    Diabetes Sister     Social History   Socioeconomic History   Marital status: Widowed    Spouse name: Not on file   Number of children: Not on file   Years of education: Not on file   Highest education level: Not on file  Occupational History   Not on file  Tobacco Use   Smoking status: Never   Smokeless tobacco: Never  Substance and Sexual Activity   Alcohol use: Yes    Comment: occasonal 1/month   Drug use: Never   Sexual activity: Not on file  Other Topics Concern   Not on file  Social History Narrative   Not on file   Social Determinants of Health   Financial Resource Strain: Not on file  Food Insecurity: Not on file   Transportation Needs: Not on file  Physical Activity: Not on file  Stress: Not on file  Social Connections: Not on file  Intimate Partner Violence: Not on file    Review of Systems: ROS negative except for what is noted on the assessment and plan.  Objective:   Vitals:   10/13/21 0936  BP: 107/69  Pulse: (!) 57  Temp: 98.1 F (36.7 C)  TempSrc: Oral  SpO2: 98%  Weight: 173 lb 8 oz (78.7 kg)  Height: '5\' 5"'$  (1.651 m)    Physical Exam: General: Well-appearing male. Cardiovascular: Regular rate and rhythm.  Radial pulses 2+. Pulmonary: Normal work of breathing.  Lungs clear to auscultation bilaterally. Abdominal: Completely healed surgical scar around umbilicus.  Soft.  Nontender.  Nondistended.  No organomegaly. Musculoskeletal: Normal bulk and tone. Skin: Warm and dry. Neurologic: Alert and oriented. Psychiatric: Pleasant.  Appropriate mood and affect.   Assessment & Plan:  Hypertension Patient continues to take lisinopril 2.5 mg daily.  He is an avid walker, and walks a few miles every day.  He tries to eat a healthy diet that includes lots of fish and chicken, while avoiding sugary drinks like sodas and sweet tea.  Clinic blood pressure is 107/69.  Well-controlled  hypertension.  I suspect this good control is largely due to lifestyle rather than the small dose of lisinopril this patient takes.  Since this patient has not had laboratory monitoring for some time I will collect a BMP today. - Follow-up BMP results - For now, continue lisinopril 2.5 mg daily.  Type 2 diabetes mellitus without complications (HCC) Type 2 diabetes without complications.  Has been on insulin in the past, but not currently treated with insulin.  Interestingly, patient was diagnosed with type 1 diabetes by his previous primary care provider at [facility].  However insulin therapy was discontinued after the patient lost a substantial amount of weight.  He remains adherent to healthy lifestyle.  However,  he has a strong family history of diabetes.  Today, his hemoglobin A1c has risen to 7.4 from 6.57 months ago.  BMI remains stable 28.87 kg/m.  I discussed genetic contribution to the pathophysiology of this disease with the patient.  I recommended increasing his dapagliflozin from 5 to 10 mg daily.  He also takes 1000 mg metformin twice daily.  He is amenable to combining these medications into 1 pill. - Start empagliflozin-metformin 06-998 mg twice daily. - Follow-up in 3 months for repeat hemoglobin A1c.  Thrombocytopenia (HCC) History of chronic, stable, mild thrombocytopenia.  No episodes of bleeding or easy bruising.  Present since at least 2011 and noted during several follow-up visits in this clinic and at outside clinics since then.  Uncertain etiology.  We will repeat a CBC today. - Follow-up CBC for platelet count.  Hyperlipidemia Patient is taking 10 mg of rosuvastatin daily.  Last cholesterol panel was at an outside facility in 2019.  TC 148.  HDL 43.  LDL 83.  Repeat lipid panel today. - Follow-up lipid panel results.  Abdominal pain Patient complains of intermittent, mild right lower quadrant abdominal pain.  He first noticed it a few weeks ago.  It bothers him every few days.  When it occurs it starts gradually.  It usually happens at night when he lays flat.  It does not prevent him from getting sleep.  It does not prevent him from his usual daily activities.  It does not have any triggers.  He denies nausea, vomiting, loss of appetite, diarrhea, constipation, blood in stool, fevers.  Physical exam is reassuring.  No tenderness to deep palpation of abdomen.  No organomegaly.  Leading suspicion is functional abdominal pain, perhaps secondary to treatment with metformin.  I am not suspicious for any acute or life-threatening cause of his abdominal pain like appendicitis, colitis, cholecystitis.  No further work-up is warranted at this time.  I informed patient of worrisome signs or  symptoms like nausea, vomiting, loss of appetite, blood in stool, fevers, and to seek repeat evaluation if any of these occur.    Patient seen with Dr. Archie Endo, M.D. [facility]  PGY-1 Pager: [phone] Date 10/13/2021  Time 5:25 PM

## 2021-10-14 LAB — LIPID PANEL
Chol/HDL Ratio: 2.5 ratio (ref 0.0–5.0)
Cholesterol, Total: 122 mg/dL (ref 100–199)
HDL: 49 mg/dL (ref >39)
LDL Chol Calc (NIH): 59 mg/dL (ref 0–99)
Triglycerides: 69 mg/dL (ref 0–149)
VLDL Cholesterol Cal: 14 mg/dL (ref 5–40)

## 2021-10-14 LAB — BMP8+ANION GAP
Anion Gap: 18 mmol/L (ref 10.0–18.0)
BUN/Creatinine Ratio: 18 (ref 9–20)
BUN: 13 mg/dL (ref 6–24)
CO2: 22 mmol/L (ref 20–29)
Calcium: 9.8 mg/dL (ref 8.7–10.2)
Chloride: 99 mmol/L (ref 96–106)
Creatinine, Ser: 0.72 mg/dL — ABNORMAL LOW (ref 0.76–1.27)
Glucose: 131 mg/dL — ABNORMAL HIGH (ref 70–99)
Potassium: 4 mmol/L (ref 3.5–5.2)
Sodium: 139 mmol/L (ref 134–144)
eGFR: 105 mL/min/{1.73_m2} (ref >59)

## 2021-10-14 LAB — CBC
Hematocrit: 50.9 % (ref 37.5–51.0)
Hemoglobin: 17.4 g/dL (ref 13.0–17.7)
MCH: 30.7 pg (ref 26.6–33.0)
MCHC: 34.2 g/dL (ref 31.5–35.7)
MCV: 90 fL (ref 79–97)
Platelets: 124 10*3/uL — ABNORMAL LOW (ref 150–450)
RBC: 5.66 x10E6/uL (ref 4.14–5.80)
RDW: 13.3 % (ref 11.6–15.4)
WBC: 3.4 10*3/uL (ref 3.4–10.8)

## 2021-10-15 NOTE — Progress Notes (Signed)
Internal Medicine Clinic Attending  I saw and evaluated the patient.  I personally confirmed the key portions of the history and exam documented by the resident  and I reviewed pertinent patient test results.  The assessment, diagnosis, and plan were formulated together and I agree with the documentation in the resident's note.  

## 2021-11-18 ENCOUNTER — Telehealth: Payer: Self-pay | Admitting: *Deleted

## 2021-11-18 NOTE — Telephone Encounter (Signed)
For insurance will ned new prescrioption.  How many times daily for Insurance coverage,.

## 2021-11-24 MED ORDER — GLUCOSE BLOOD VI STRP: ORAL_STRIP | 12 refills | Status: DC

## 2021-11-24 NOTE — Telephone Encounter (Signed)
Need new prescription written to include how many times she should test daily.

## 2021-12-24 ENCOUNTER — Other Ambulatory Visit: Payer: Self-pay | Admitting: Student

## 2021-12-24 DIAGNOSIS — E119 Type 2 diabetes mellitus without complications: Secondary | ICD-10-CM

## 2022-01-10 ENCOUNTER — Other Ambulatory Visit: Payer: Self-pay | Admitting: Internal Medicine

## 2022-01-10 DIAGNOSIS — E119 Type 2 diabetes mellitus without complications: Secondary | ICD-10-CM

## 2022-01-12 ENCOUNTER — Encounter: Payer: Self-pay | Admitting: Student

## 2022-01-18 ENCOUNTER — Ambulatory Visit (INDEPENDENT_AMBULATORY_CARE_PROVIDER_SITE_OTHER): Payer: Commercial Managed Care - HMO | Admitting: Internal Medicine

## 2022-01-18 ENCOUNTER — Encounter: Payer: Self-pay | Admitting: Internal Medicine

## 2022-01-18 VITALS — BP 121/72 | HR 67 | Temp 97.8°F | Ht 65.0 in | Wt 173.3 lb

## 2022-01-18 DIAGNOSIS — Z86010 Personal history of colonic polyps: Secondary | ICD-10-CM | POA: Diagnosis not present

## 2022-01-18 DIAGNOSIS — I10 Essential (primary) hypertension: Secondary | ICD-10-CM

## 2022-01-18 DIAGNOSIS — Z23 Encounter for immunization: Secondary | ICD-10-CM

## 2022-01-18 DIAGNOSIS — E119 Type 2 diabetes mellitus without complications: Secondary | ICD-10-CM

## 2022-01-18 LAB — POCT GLYCOSYLATED HEMOGLOBIN (HGB A1C): Hemoglobin A1C: 6.9 % — AB (ref 4.0–5.6)

## 2022-01-18 LAB — GLUCOSE, CAPILLARY: Glucose-Capillary: 147 mg/dL — ABNORMAL HIGH (ref 70–99)

## 2022-01-18 MED ORDER — SYNJARDY 5-1000 MG PO TABS: 1.0000 | ORAL_TABLET | Freq: Two times a day (BID) | ORAL | 11 refills | Status: DC

## 2022-01-18 NOTE — Assessment & Plan Note (Signed)
Matthew Chase is following up for diabetes.  His last hemoglobin A1c was in September 2023 at 7.4.  Repeat A1c is 6.9 today.  Medications include glipizide-metformin 06-998 twice daily.  Checks blood sugar at least once daily and finds this helps to control his diabetes better.  He previously took insulin.  He feels that he has put in more work over the past 2 months to take care of his health such as working out and eating better. A/P: Hemoglobin A1c well-controlled at 6.9.  Congratulated patient on his hard work.  He is changing insurances in January and was told he would need to switch to a combination diabetic pill to [place] to remain on formulary. -Synjardy 06-998 twice daily sent to pharmacy with instructions to refill January 2024 -A1c repeated -Repeat urine microalbumin

## 2022-01-18 NOTE — Progress Notes (Signed)
Subjective:  CC: flu shot and diabetes follow-up  HPI:  MatthewMatthew Chase is a 61 y.o. male with a past medical history stated below and presents today to get flu shot and follow-up on diabetes.  Since September, he has increased exercise.  His goal is to get 5000 steps daily for 30 to exercise.  He has also been monitoring diet.  He continues to check blood sugars at least once daily. Please see problem based assessment and plan for additional details.  Past Medical History:  Diagnosis Date   Glaucoma    History of adenomatous polyp of colon    Hyperlipidemia    Hypertension    Obesity (BMI 30-39.9)    Type 2 diabetes mellitus ([place])     Current Outpatient Medications on File Prior to Visit  Medication Sig Dispense Refill   Brinzolamide-Brimonidine 1-0.2 % SUSP INSTILL 1 DROP IN [facility] EYE TWICE A DAY     glucose blood test strip Use as instructed to measure blood glucose once daily as needed. 200 each 12   latanoprost (XALATAN) 0.005 % ophthalmic solution INSTILL 1 DROP IN BOTH EYES AT BEDTIME     lisinopril (ZESTRIL) 2.5 MG tablet Take 1 tablet by mouth once daily 90 tablet 0   Multiple Vitamin (MULTIVITAMIN) capsule Take 1 capsule by mouth daily. 90 capsule 2   rosuvastatin (CRESTOR) 10 MG tablet TAKE ONE-HALF TABLET BY MOUTH ONCE A DAY FOR CHOLESTEROL 60 tablet 2   No current facility-administered medications on file prior to visit.    Family History  Problem Relation Age of Onset   Diabetes Father    Hypertension Father    Diabetes Sister     Social History   Socioeconomic History   Marital status: Widowed    Spouse name: Not on file   Number of children: Not on file   Years of education: Not on file   Highest education level: Not on file  Occupational History   Not on file  Tobacco Use   Smoking status: Never   Smokeless tobacco: Never  Substance and Sexual Activity   Alcohol use: Yes    Comment: occasonal 1/month   Drug use: Never   Sexual activity: Not  on file  Other Topics Concern   Not on file  Social History Narrative   Not on file   Social Determinants of Health   Financial Resource Strain: Low Risk  (01/18/2022)   Overall Financial Resource Strain (CARDIA)    Difficulty of Paying Living Expenses: Not hard at all  Food Insecurity: No Food Insecurity (01/18/2022)   Hunger Vital Sign    Worried About Running Out of Food in the Last Year: Never true    [place] in the Last Year: Never true  Transportation Needs: No Transportation Needs (01/18/2022)   PRAPARE - Hydrologist (Medical): No    Lack of Transportation (Non-Medical): No  Physical Activity: Not on file  Stress: No Stress Concern Present (01/18/2022)   [place]    Feeling of Stress : Not at all  Social Connections: Moderately Integrated (01/18/2022)   Social Connection and Isolation Panel [NHANES]    Frequency of Communication with Friends and Family: Twice a week    Frequency of Social Gatherings with Friends and Family: Twice a week    Attends Religious Services: More than 4 times per year    Active Member of [employer] or Organizations:  Yes    Attends Club or Organization Meetings: More than 4 times per year    Marital Status: Widowed  Intimate Partner Violence: Not At Risk (01/18/2022)   Humiliation, Afraid, Rape, and Kick questionnaire    Fear of Current or Ex-Partner: No    Emotionally Abused: No    Physically Abused: No    Sexually Abused: No    Review of Systems: ROS negative except for what is noted on the assessment and plan.  Objective:   Vitals:   01/18/22 1507  BP: 121/72  Pulse: 67  Temp: 97.8 F (36.6 C)  TempSrc: Oral  SpO2: 98%  Weight: 173 lb 4.8 oz (78.6 kg)  Height: '5\' 5"'$  (1.651 m)    Physical Exam: Constitutional: well-appearing  Cardiovascular: regular rate and rhythm, no m/r/g Pulmonary/Chest: normal work of breathing on room  air, lungs clear to auscultation bilaterally Abdominal: soft, non-tender, non-distended MSK: normal bulk and tone Skin: warm and dry  Assessment & Plan:  Type 2 diabetes mellitus without complications [facility]) Matthew Chase is following up for diabetes.  His last hemoglobin A1c was in September 2023 at 7.4.  Repeat A1c is 6.9 today.  Medications include glipizide-metformin 06-998 twice daily.  Checks blood sugar at least once daily and finds this helps to control his diabetes better.  He previously took insulin.  He feels that he has put in more work over the past 2 months to take care of his health such as working out and eating better. A/P: Hemoglobin A1c well-controlled at 6.9.  Congratulated patient on his hard work.  He is changing insurances in January and was told he would need to switch to a combination diabetic pill to [place] to remain on formulary. -Synjardy 06-998 twice daily sent to pharmacy with instructions to refill January 2024 -A1c repeated -Repeat urine microalbumin  Hypertension Blood pressure is well-controlled at 121/72.  He takes lisinopril 2.5 mg daily.   History of adenomatous polyp of colon Referral was made 1/23 for patient to have repeat colonoscopy.  On chart review following the visit I do not see that this follow-up with GI has been coordinated.  Unable to reach patient by phone.  Voicemail left to return call to clinic. -Will send message to front desk to see if another referral is needed for repeat colonoscopy.   Patient discussed with Dr. Wallace Cullens [name], D.O. [facility]  PGY-2 Pager: [phone]  Phone: [phone] Date 01/18/2022  Time 5:33 PM

## 2022-01-18 NOTE — Patient Instructions (Signed)
Thank you, Mr.Ash Glastetter for allowing [place] to provide your care today.   Diabetes: Good work! Your A1c looked great at 6.9. our goal is to keep that less than 7. I went ahead and sent the Synjardy with instructions to the pharmacy not to fill until 1/24. I will call with urine results.  You do not need to take baby aspirin. Recommendations have moved away from that in patients that do not have history of heart attack or stroke.  I have ordered the following labs for you:   Lab Orders         Microalbumin / Creatinine Urine Ratio         Glucose, capillary         POC Hbg A1C       I have ordered the following medication/changed the following medications:   Stop the following medications: Medications Discontinued During This Encounter  Medication Reason   XIGDUO XR 06-998 MG TB24 Not covered by the pt's insurance   aspirin 81 MG EC tablet      Start the following medications: Meds ordered this encounter  Medications   Empagliflozin-metFORMIN HCl (SYNJARDY) 06-998 MG TABS    Sig: Take 1 tablet by mouth 2 (two) times daily.    Dispense:  60 tablet    Refill:  11    Do not fill until 01/24. Insurance is changing and is preferred formulary.     Follow up: 3 months   We look forward to seeing you next time. Please call our clinic at [phone] if you have any questions or concerns. The best time to call is Monday-Friday from 9am-4pm, but there is someone available 24/7. If after hours or the weekend, call the main hospital number and ask for the Internal Medicine Resident On-Call. If you need medication refills, please notify your pharmacy one week in advance and they will send [place] a request.   Thank you for trusting me with your care. Wishing you the best!   Christiana Fuchs, [place]

## 2022-01-18 NOTE — Assessment & Plan Note (Signed)
Referral was made 1/23 for patient to have repeat colonoscopy.  On chart review following the visit I do not see that this follow-up with GI has been coordinated.  Unable to reach patient by phone.  Voicemail left to return call to clinic. -Will send message to front desk to see if another referral is needed for repeat colonoscopy.

## 2022-01-18 NOTE — Assessment & Plan Note (Signed)
Blood pressure is well-controlled at 121/72.  He takes lisinopril 2.5 mg daily.

## 2022-01-19 LAB — MICROALBUMIN / CREATININE URINE RATIO
Creatinine, Urine: 40.2 mg/dL
Microalb/Creat Ratio: 11 mg/g creat (ref 0–29)
Microalbumin, Urine: 4.3 ug/mL

## 2022-01-19 NOTE — Progress Notes (Signed)
Internal Medicine Clinic Attending  Case discussed with Dr. Masters  At the time of the visit.  We reviewed the resident's history and exam and pertinent patient test results.  I agree with the assessment, diagnosis, and plan of care documented in the resident's note.  

## 2022-02-02 ENCOUNTER — Telehealth: Payer: Self-pay | Admitting: Gastroenterology

## 2022-02-02 NOTE — Telephone Encounter (Signed)
Good Morning Dr Candis Schatz,  Supervising MD 12/27 AM  This patient is calling wishing to have his colonoscopy done here he had one done about 5 years ago at [facility], states he would like to come here for his next one due to [place] being closer to him and states his doctor highly recommended [place]. Patients colonoscopy records are in Epic for you to review. Please advise on scheduling.  Thank you!

## 2022-02-07 ENCOUNTER — Encounter: Payer: Self-pay | Admitting: Student

## 2022-02-08 ENCOUNTER — Encounter: Payer: Self-pay | Admitting: Student

## 2022-02-09 NOTE — Telephone Encounter (Signed)
Patient walked into the clinic this morning inquiring about refills on his medications that he requested.  Made the patient aware that we did receive his request via my chart and that we do ask for 48 hrs time frame on medication refills and to check with his pharmacy tomorrow.  Forwarding message to triage.

## 2022-02-10 ENCOUNTER — Telehealth: Payer: Self-pay

## 2022-02-10 ENCOUNTER — Other Ambulatory Visit: Payer: Self-pay | Admitting: Student

## 2022-02-10 DIAGNOSIS — E785 Hyperlipidemia, unspecified: Secondary | ICD-10-CM

## 2022-02-10 DIAGNOSIS — E119 Type 2 diabetes mellitus without complications: Secondary | ICD-10-CM

## 2022-02-10 MED ORDER — SYNJARDY 5-1000 MG PO TABS: 1.0000 | ORAL_TABLET | Freq: Two times a day (BID) | ORAL | 11 refills | Status: DC

## 2022-02-10 MED ORDER — ROSUVASTATIN CALCIUM 10 MG PO TABS: ORAL_TABLET | ORAL | 4 refills | Status: DC

## 2022-02-10 NOTE — Telephone Encounter (Signed)
Decision:Approved Matthew Chase (Key: [id number]) PA Case ID #: [id number] Rx #: [id number] Need Help? Call us at [phone] Outcome Approved today Your PA request has been approved. Additional information will be provided in the approval communication. (Message 1145) Authorization Expiration Date: 02/10/2023 Drug Synjardy 5-'1000MG'$  tablets ePA cloud logo Form [employer] Electronic PA Form [phone] NCPDP) Original Claim Info [id number] PRIOR AUTH REQ-MD CALL [phone] THERAPY IS REQUIREDDRUG REQUIRES PRIOR AUTHORIZATION(PHARMACY HELP DESK [phone])

## 2022-02-10 NOTE — Telephone Encounter (Signed)
Prior Authorization for patient (synjardy) came through on cover my meds was submitted with last office notes and labs awaiting approval or denial

## 2022-02-14 ENCOUNTER — Encounter: Payer: Self-pay | Admitting: Gastroenterology

## 2022-02-14 NOTE — Telephone Encounter (Signed)
Patient scheduled for direct colon 2/29

## 2022-03-14 ENCOUNTER — Encounter: Payer: Self-pay | Admitting: Student

## 2022-03-14 ENCOUNTER — Other Ambulatory Visit: Payer: Self-pay | Admitting: Internal Medicine

## 2022-03-14 MED ORDER — GLUCOSE BLOOD VI STRP: ORAL_STRIP | 12 refills | Status: AC

## 2022-03-17 ENCOUNTER — Ambulatory Visit (AMBULATORY_SURGERY_CENTER): Payer: 59 | Admitting: *Deleted

## 2022-03-17 ENCOUNTER — Encounter: Payer: Self-pay | Admitting: Gastroenterology

## 2022-03-17 VITALS — Ht 65.0 in | Wt 168.0 lb

## 2022-03-17 DIAGNOSIS — Z86010 Personal history of colonic polyps: Secondary | ICD-10-CM

## 2022-03-17 MED ORDER — NA SULFATE-K SULFATE-MG SULF 17.5-3.13-1.6 GM/177ML PO SOLN: 1.0000 | Freq: Once | ORAL | 0 refills | Status: AC

## 2022-03-17 NOTE — Progress Notes (Signed)
Pt's previsit is done over the phone and all paperwork (prep instructions) sent to patient.  Pt's name and DOB verified at the beginning of the previsit.  Pt denies any difficulty with ambulating.    No egg or soy allergy known to patient  No issues known to pt with past sedation with any surgeries or procedures Patient denies ever being told they had issues or difficulty with intubation  No FH of Malignant Hyperthermia Pt is not on diet pills Pt is not on  home 02  Pt is not on blood thinners  Pt denies issues with constipation  Pt is not on dialysis Pt denies any upcoming cardiac testing Pt encouraged to use to use Singlecare or Goodrx to reduce cost  Patient's chart reviewed by Osvaldo Angst CNRA prior to previsit and patient appropriate for the [place].  Previsit completed and red dot placed by patient's name on their procedure day (on provider's schedule).

## 2022-03-29 ENCOUNTER — Encounter: Payer: Self-pay | Admitting: Student

## 2022-03-29 DIAGNOSIS — E119 Type 2 diabetes mellitus without complications: Secondary | ICD-10-CM

## 2022-03-31 MED ORDER — LISINOPRIL 2.5 MG PO TABS: 2.5000 mg | ORAL_TABLET | Freq: Every day | ORAL | 0 refills | Status: DC

## 2022-04-07 ENCOUNTER — Ambulatory Visit (AMBULATORY_SURGERY_CENTER): Payer: 59 | Admitting: Gastroenterology

## 2022-04-07 ENCOUNTER — Encounter: Payer: Self-pay | Admitting: Gastroenterology

## 2022-04-07 VITALS — BP 99/63 | HR 57 | Temp 97.8°F | Resp 14 | Ht 65.0 in | Wt 168.0 lb

## 2022-04-07 DIAGNOSIS — D123 Benign neoplasm of transverse colon: Secondary | ICD-10-CM

## 2022-04-07 DIAGNOSIS — Z09 Encounter for follow-up examination after completed treatment for conditions other than malignant neoplasm: Secondary | ICD-10-CM | POA: Diagnosis not present

## 2022-04-07 DIAGNOSIS — Z1211 Encounter for screening for malignant neoplasm of colon: Secondary | ICD-10-CM | POA: Diagnosis not present

## 2022-04-07 DIAGNOSIS — Z86010 Personal history of colonic polyps: Secondary | ICD-10-CM | POA: Diagnosis not present

## 2022-04-07 MED ORDER — SODIUM CHLORIDE 0.9 % IV SOLN: 500.0000 mL | Freq: Once | INTRAVENOUS | Status: DC

## 2022-04-07 NOTE — Patient Instructions (Addendum)
Resume previous diet. Continue present medications. Await pathology results. Repeat colonoscopy in 5 years for surveillance.  YOU HAD AN ENDOSCOPIC PROCEDURE TODAY AT [facility]:   Refer to the procedure report that was given to you for any specific questions about what was found during the examination.  If the procedure report does not answer your questions, please call your gastroenterologist to clarify.  If you requested that your care partner not be given the details of your procedure findings, then the procedure report has been included in a sealed envelope for you to review at your convenience later.  YOU SHOULD EXPECT: Some feelings of bloating in the abdomen. Passage of more gas than usual.  Walking can help get rid of the air that was put into your GI tract during the procedure and reduce the bloating. If you had a lower endoscopy (such as a colonoscopy or flexible sigmoidoscopy) you may notice spotting of blood in your stool or on the toilet paper. If you underwent a bowel prep for your procedure, you may not have a normal bowel movement for a few days.  Please Note:  You might notice some irritation and congestion in your nose or some drainage.  This is from the oxygen used during your procedure.  There is no need for concern and it should clear up in a day or so.  SYMPTOMS TO REPORT IMMEDIATELY:  Following lower endoscopy (colonoscopy or flexible sigmoidoscopy):  Excessive amounts of blood in the stool  Significant tenderness or worsening of abdominal pains  Swelling of the abdomen that is new, acute  Fever of 100F or higher   For urgent or emergent issues, a gastroenterologist can be reached at any hour by calling [phone]. Do not use MyChart messaging for urgent concerns.    DIET:  We do recommend a small meal at first, but then you may proceed to your regular diet.  Drink plenty of fluids but you should avoid alcoholic beverages for 24  hours.  ACTIVITY:  You should plan to take it easy for the rest of today and you should NOT DRIVE or use heavy machinery until tomorrow (because of the sedation medicines used during the test).    FOLLOW UP: Our staff will call the number listed on your records the next business day following your procedure.  We will call around 7:15- 8:00 am to check on you and address any questions or concerns that you may have regarding the information given to you following your procedure. If we do not reach you, we will leave a message.     If any biopsies were taken you will be contacted by phone or by letter within the next 1-3 weeks.  Please call us at [phone] if you have not heard about the biopsies in 3 weeks.    SIGNATURES/CONFIDENTIALITY: You and/or your care partner have signed paperwork which will be entered into your electronic medical record.  These signatures attest to the fact that that the information above on your After Visit Summary has been reviewed and is understood.  Full responsibility of the confidentiality of this discharge information lies with you and/or your care-partner.

## 2022-04-07 NOTE — Progress Notes (Signed)
[facility] History and Physical   Primary Care Physician:  Rick Duff, MD   Reason for Procedure:   Colon cancer screening/polyp surveillance  Plan:    Colonoscopy.     HPI: Matthew Chase is a 62 y.o. male undergoing surveillance colonoscopy.  He has no family history of colon cancer and no chronic GI symptoms. A colonoscopy in 2014 was notable for 2 large polyps one of which was not suitable for endoscopic resection.  He underwent a R hemicolectomy.  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 His last colonoscopy was in 2018 and two small tubular adenomas were removed.   Past Medical History:  Diagnosis Date   Glaucoma    History of adenomatous polyp of colon    Hyperlipidemia    Hypertension    Obesity (BMI 30-39.9)    Type 2 diabetes mellitus ([place])     Past Surgical History:  Procedure Laterality Date   COLONOSCOPY     2014. 2018   LUMBAR [place] SURGERY     2012    Prior to Admission medications   Medication Sig Start Date End Date Taking? Authorizing Provider  Brinzolamide-Brimonidine 1-0.2 % SUSP INSTILL 1 DROP IN [facility] EYE TWICE A DAY 09/04/20  Yes [provider]  Empagliflozin-metFORMIN HCl (SYNJARDY) 06-998 MG TABS Take 1 tablet by mouth 2 (two) times daily. 02/10/22  Yes Rick Duff, MD  glucose blood test strip Use as instructed to measure blood glucose once daily as needed. 11/24/21  Yes Charise Killian, MD  glucose blood test strip Use as instructed 03/14/22  Yes Delene Ruffini, MD  latanoprost (XALATAN) 0.005 % ophthalmic solution INSTILL 1 DROP IN BOTH EYES AT BEDTIME 05/07/20  Yes [provider]  lisinopril (ZESTRIL) 2.5 MG tablet Take 1 tablet (2.5 mg total) by mouth daily. 03/31/22  Yes Rick Duff, MD  Multiple Vitamin (MULTIVITAMIN) capsule Take 1 capsule by mouth daily. 03/18/21  Yes Aslam, Loralyn Freshwater, MD  rosuvastatin (CRESTOR) 10 MG tablet TAKE ONE-HALF  TABLET BY MOUTH ONCE A DAY FOR CHOLESTEROL 02/10/22  Yes Rick Duff, MD  timolol (TIMOPTIC) 0.5 % ophthalmic solution Place into both eyes 2 (two) times daily. 03/16/22  Yes [provider]    Current Outpatient Medications  Medication Sig Dispense Refill   Brinzolamide-Brimonidine 1-0.2 % SUSP INSTILL 1 DROP IN EACH EYE TWICE A DAY     Empagliflozin-metFORMIN HCl (SYNJARDY) 06-998 MG TABS Take 1 tablet by mouth 2 (two) times daily. 180 tablet 11   glucose blood test strip Use as instructed to measure blood glucose once daily as needed. 200 each 12   glucose blood test strip Use as instructed 100 each 12   latanoprost (XALATAN) 0.005 % ophthalmic solution INSTILL 1 DROP IN BOTH EYES AT BEDTIME     lisinopril (ZESTRIL) 2.5 MG tablet Take 1 tablet (2.5 mg total) by mouth daily. 90 tablet 0   Multiple Vitamin (MULTIVITAMIN) capsule Take 1 capsule by mouth daily. 90 capsule 2   rosuvastatin (CRESTOR) 10 MG tablet TAKE ONE-HALF TABLET BY MOUTH ONCE A DAY FOR CHOLESTEROL 60 tablet 4   timolol (TIMOPTIC) 0.5 % ophthalmic solution Place into both eyes 2 (two) times daily.     Current Facility-Administered Medications  Medication Dose Route Frequency Provider Last Rate Last Admin   0.9 %  sodium chloride infusion  500 mL Intravenous Once Daryel November, MD        Allergies as of 04/07/2022   (No  Known Allergies)    Family History  Problem Relation Age of Onset   Diabetes Father    Hypertension Father    Diabetes Sister    Colon cancer Neg Hx    Esophageal cancer Neg Hx    Rectal cancer Neg Hx    Stomach cancer Neg Hx     Social History   Socioeconomic History   Marital status: Widowed    Spouse name: Not on file   Number of children: Not on file   Years of education: Not on file   Highest education level: Not on file  Occupational History   Not on file  Tobacco Use   Smoking status: Never   Smokeless tobacco: Never  Vaping Use   Vaping Use: Never used   Substance and Sexual Activity   Alcohol use: Yes    Comment: occasonal 1/month   Drug use: Never   Sexual activity: Not on file  Other Topics Concern   Not on file  Social History Narrative   Not on file   Social Determinants of Health   Financial Resource Strain: Low Risk  (01/18/2022)   Overall Financial Resource Strain (CARDIA)    Difficulty of Paying Living Expenses: Not hard at all  Food Insecurity: No Food Insecurity (01/18/2022)   Hunger Vital Sign    Worried About Running Out of Food in the Last Year: Never true    Notus in the Last Year: Never true  Transportation Needs: No Transportation Needs (01/18/2022)   PRAPARE - Hydrologist (Medical): No    Lack of Transportation (Non-Medical): No  Physical Activity: Not on file  Stress: No Stress Concern Present (01/18/2022)   [place]    Feeling of Stress : Not at all  Social Connections: Moderately Integrated (01/18/2022)   Social Connection and Isolation Panel [NHANES]    Frequency of Communication with Friends and Family: Twice a week    Frequency of Social Gatherings with Friends and Family: Twice a week    Attends Religious Services: More than 4 times per year    Active Member of [employer] or Organizations: Yes    Attends Archivist Meetings: More than 4 times per year    Marital Status: Widowed  Intimate Partner Violence: Not At Risk (01/18/2022)   Humiliation, Afraid, Rape, and Kick questionnaire    Fear of Current or Ex-Partner: No    Emotionally Abused: No    Physically Abused: No    Sexually Abused: No    Review of Systems:  All other review of systems negative except as mentioned in the HPI.  Physical Exam: Vital signs BP 130/79   Pulse 62   Temp 97.8 F (36.6 C)   Ht '5\' 5"'$  (1.651 m)   Wt 168 lb (76.2 kg)   SpO2 98%   BMI 27.96 kg/m   General:   Alert,  Well-developed,  well-nourished, pleasant and cooperative in NAD Airway:  Mallampati 2 Lungs:  Clear throughout to auscultation.   Heart:  Regular rate and rhythm; no murmurs, clicks, rubs,  or gallops. Abdomen:  Soft, nontender and nondistended. Normal bowel sounds.   Neuro/Psych:  Normal mood and affect. A and O x 3   Daveon Arpino E. Candis Schatz, MD [facility]

## 2022-04-07 NOTE — Progress Notes (Signed)
Pt's states no medical or surgical changes since previsit or office visit. 

## 2022-04-07 NOTE — Op Note (Signed)
[facility] Patient Name: Matthew Chase Procedure Date: 04/07/2022 11:24 AM MRN: [id number] Endoscopist: [place]. Candis Schatz , MD, [id number] Age: 62 Referring MD:  Date of Birth: 25-Dec-1960 Gender: Male Account #: [id number] Procedure:                Colonoscopy Indications:              High risk colon cancer surveillance: Personal                            history of tubulovillous adenoma with high grade                            dysplasia, surgically resected in 2013; last                            colonoscopy 2018 with 2 small tubular adenomas Medicines:                Monitored Anesthesia Care Procedure:                Pre-Anesthesia Assessment:                           - Prior to the procedure, a History and Physical                            was performed, and patient medications and                            allergies were reviewed. The patient's tolerance of                            previous anesthesia was also reviewed. The risks                            and benefits of the procedure and the sedation                            options and risks were discussed with the patient.                            All questions were answered, and informed consent                            was obtained. Prior Anticoagulants: The patient has                            taken no anticoagulant or antiplatelet agents. ASA                            Grade Assessment: III - A patient with severe                            systemic disease. After reviewing the risks and  benefits, the patient was deemed in satisfactory                            condition to undergo the procedure.                           After obtaining informed consent, the colonoscope                            was passed under direct vision. Throughout the                            procedure, the patient's blood pressure, pulse, and                            oxygen  saturations were monitored continuously. The                            Olympus CF-[id number] SN [id number] was introduced through                            the anus and advanced to the the ileocolonic                            anastomosis. The colonoscopy was performed without                            difficulty. The patient tolerated the procedure                            well. The quality of the bowel preparation was                            good. Ileocolonic anastomosis, neoterminal ileum                            were photographed. The bowel preparation used was                            SUPREP via split dose instruction. Scope In: 11:29:37 AM Scope Out: 11:45:04 AM Scope Withdrawal Time: 0 hours 11 minutes 38 seconds  Total Procedure Duration: 0 hours 15 minutes 27 seconds  Findings:                 The perianal and digital rectal examinations were                            normal. Pertinent negatives include normal                            sphincter tone and no palpable rectal lesions.                           A 2 mm polyp was found in the transverse colon. The  polyp was sessile. The polyp was removed with a                            cold snare. Resection and retrieval were complete.                            Estimated blood loss was minimal.                           There was evidence of a prior side-to-side                            ileo-colonic anastomosis in the proximal transverse                            colon. This was patent and was characterized by                            healthy appearing mucosa. The anastomosis was                            traversed.                           The neo-terminal ileum appeared normal.                           The exam was otherwise normal throughout the                            examined colon.                           Non-bleeding internal hemorrhoids were found during                             retroflexion. The hemorrhoids were Grade I                            (internal hemorrhoids that do not prolapse).                           No additional abnormalities were found on                            retroflexion. Complications:            No immediate complications. Estimated Blood Loss:     Estimated blood loss was minimal. Impression:               - One 2 mm polyp in the transverse colon, removed                            with a cold snare. Resected and retrieved.                           -  Patent side-to-side ileo-colonic anastomosis,                            characterized by healthy appearing mucosa.                           - The examined portion of the ileum was normal.                           - Non-bleeding internal hemorrhoids. Recommendation:           - Patient has a contact number available for                            emergencies. The signs and symptoms of potential                            delayed complications were discussed with the                            patient. Return to normal activities tomorrow.                            Written discharge instructions were provided to the                            patient.                           - Resume previous diet.                           - Continue present medications.                           - Await pathology results.                           - Repeat colonoscopy in 5 years for surveillance. [name] E. Candis Schatz, MD 04/07/2022 11:51:46 AM This report has been signed electronically.

## 2022-04-07 NOTE — Progress Notes (Signed)
Vss nad trans to pacu °

## 2022-04-08 ENCOUNTER — Telehealth: Payer: Self-pay

## 2022-04-08 NOTE — Telephone Encounter (Signed)
  Follow up Call-     04/07/2022   10:50 AM  Call back number  Post procedure Call Back phone  # [phone]  Permission to leave phone message Yes     Patient questions:  Do you have a fever, pain , or abdominal swelling? No. Pain Score  0 *  Have you tolerated food without any problems? Yes.    Have you been able to return to your normal activities? Yes.    Do you have any questions about your discharge instructions: Diet   No. Medications  No. Follow up visit  No.  Do you have questions or concerns about your Care? No.  Actions: * If pain score is 4 or above: No action needed, pain <4.

## 2022-04-13 NOTE — Progress Notes (Signed)
Matthew Chase,  The small polyp which I removed during your recent procedure was proven to be completely benign but is considered a "pre-cancerous" polyp that MAY have grown into cancer if it had not been removed.  Studies shows that at least 20% of women over age 60 and 30% of men over age 56 have pre-cancerous polyps.  Because your history of a large advanced polyp, I would recommend you repeat a colonoscopy again in 5 years.  If you develop any new rectal bleeding, abdominal pain or significant bowel habit changes, please contact me before then.

## 2022-05-17 DIAGNOSIS — H401133 Primary open-angle glaucoma, bilateral, severe stage: Secondary | ICD-10-CM | POA: Diagnosis not present

## 2022-05-24 ENCOUNTER — Encounter: Payer: Self-pay | Admitting: Student

## 2022-05-24 DIAGNOSIS — H401133 Primary open-angle glaucoma, bilateral, severe stage: Secondary | ICD-10-CM | POA: Diagnosis not present

## 2022-05-31 ENCOUNTER — Other Ambulatory Visit: Payer: Self-pay | Admitting: Student

## 2022-05-31 ENCOUNTER — Encounter: Payer: Self-pay | Admitting: Student

## 2022-05-31 DIAGNOSIS — D696 Thrombocytopenia, unspecified: Secondary | ICD-10-CM

## 2022-05-31 DIAGNOSIS — E119 Type 2 diabetes mellitus without complications: Secondary | ICD-10-CM

## 2022-06-19 ENCOUNTER — Other Ambulatory Visit: Payer: Self-pay | Admitting: Student

## 2022-06-19 DIAGNOSIS — E119 Type 2 diabetes mellitus without complications: Secondary | ICD-10-CM

## 2022-06-27 ENCOUNTER — Encounter: Payer: Self-pay | Admitting: Student

## 2022-06-27 ENCOUNTER — Ambulatory Visit (INDEPENDENT_AMBULATORY_CARE_PROVIDER_SITE_OTHER): Payer: 59 | Admitting: Student

## 2022-06-27 VITALS — BP 113/75 | HR 52 | Wt 167.5 lb

## 2022-06-27 DIAGNOSIS — Z114 Encounter for screening for human immunodeficiency virus [HIV]: Secondary | ICD-10-CM

## 2022-06-27 DIAGNOSIS — E119 Type 2 diabetes mellitus without complications: Secondary | ICD-10-CM | POA: Diagnosis not present

## 2022-06-27 DIAGNOSIS — Z1159 Encounter for screening for other viral diseases: Secondary | ICD-10-CM

## 2022-06-27 DIAGNOSIS — Z0000 Encounter for general adult medical examination without abnormal findings: Secondary | ICD-10-CM | POA: Diagnosis not present

## 2022-06-27 DIAGNOSIS — E785 Hyperlipidemia, unspecified: Secondary | ICD-10-CM

## 2022-06-27 DIAGNOSIS — I10 Essential (primary) hypertension: Secondary | ICD-10-CM

## 2022-06-27 DIAGNOSIS — D696 Thrombocytopenia, unspecified: Secondary | ICD-10-CM | POA: Diagnosis not present

## 2022-06-27 MED ORDER — LISINOPRIL 2.5 MG PO TABS: 2.5000 mg | ORAL_TABLET | Freq: Every day | ORAL | 0 refills | Status: DC

## 2022-06-27 MED ORDER — ROSUVASTATIN CALCIUM 10 MG PO TABS
ORAL_TABLET | ORAL | 4 refills | Status: DC
Start: 2022-06-27 — End: 2023-02-10

## 2022-06-27 MED ORDER — SYNJARDY 5-1000 MG PO TABS
1.0000 | ORAL_TABLET | Freq: Two times a day (BID) | ORAL | 3 refills | Status: DC
Start: 2022-06-27 — End: 2023-02-10

## 2022-06-27 NOTE — Patient Instructions (Signed)
Please stop your lisinopril.   Please continue the synjardy and crestor.

## 2022-06-28 LAB — HIV ANTIBODY (ROUTINE TESTING W REFLEX): HIV Screen 4th Generation wRfx: NONREACTIVE

## 2022-06-28 LAB — BASIC METABOLIC PANEL
BUN/Creatinine Ratio: 13 (ref 10–24)
BUN: 11 mg/dL (ref 8–27)
CO2: 25 mmol/L (ref 20–29)
Calcium: 10.3 mg/dL — ABNORMAL HIGH (ref 8.6–10.2)
Chloride: 100 mmol/L (ref 96–106)
Creatinine, Ser: 0.86 mg/dL (ref 0.76–1.27)
Glucose: 93 mg/dL (ref 70–99)
Potassium: 4.1 mmol/L (ref 3.5–5.2)
Sodium: 141 mmol/L (ref 134–144)
eGFR: 99 mL/min/{1.73_m2} (ref >59)

## 2022-06-28 LAB — HEMOGLOBIN A1C
Est. average glucose Bld gHb Est-mCnc: 157 mg/dL
Hgb A1c MFr Bld: 7.1 % — ABNORMAL HIGH (ref 4.8–5.6)

## 2022-06-28 LAB — CBC
Hematocrit: 50.7 % (ref 37.5–51.0)
Hemoglobin: 17.3 g/dL (ref 13.0–17.7)
MCH: 31.5 pg (ref 26.6–33.0)
MCHC: 34.1 g/dL (ref 31.5–35.7)
MCV: 92 fL (ref 79–97)
Platelets: 112 10*3/uL — ABNORMAL LOW (ref 150–450)
RBC: 5.5 x10E6/uL (ref 4.14–5.80)
RDW: 13.5 % (ref 11.6–15.4)
WBC: 4.8 10*3/uL (ref 3.4–10.8)

## 2022-06-28 LAB — HEPATITIS C ANTIBODY: Hep C Virus Ab: NONREACTIVE

## 2022-06-28 LAB — VITAMIN B12: Vitamin B-12: 330 pg/mL (ref 232–1245)

## 2022-06-30 DIAGNOSIS — Z114 Encounter for screening for human immunodeficiency virus [HIV]: Secondary | ICD-10-CM | POA: Insufficient documentation

## 2022-06-30 NOTE — Assessment & Plan Note (Addendum)
Vitals:   06/27/22 1309  BP: 113/75  Pulse: (!) 52  SpO2: 96%   Well controlled on a minimal amount of lisinopril (2.5mg  dose). Will discontinue his lisinopril and see how his blood pressures are on subsequent visits.

## 2022-06-30 NOTE — Assessment & Plan Note (Signed)
A1c 7.1 from 6.9 LCV.   Patient's synjardy refilled. He notes that he would like to continue with his exercise routine to see if he can eventually come off of this medication.  He recently saw his ophthalmologist Dr. Rolley Sims at [facility] center.

## 2022-06-30 NOTE — Progress Notes (Signed)
   CC: F/u of chronic medical problems   HPI:  Mr.Jerzy Danby is a 62 y.o. M with PMH per below. Please see problem based charting under encounters tab for further details.    Past Medical History:  Diagnosis Date   Glaucoma    History of adenomatous polyp of colon    Hyperlipidemia    Hypertension    Obesity (BMI 30-39.9)    Type 2 diabetes mellitus (HCC)    Review of Systems:  Please see problem based charting under encounters tab for further details.    Physical Exam:  Vitals:   06/27/22 1309  BP: 113/75  Pulse: (!) 52  SpO2: 96%  Weight: 167 lb 8 oz (76 kg)   Physical Exam  Constitutional: Well-developed, well-nourished, and in no distress.   Cardiovascular: Normal rate, regular rhythm, intact distal pulses. No gallop and no friction rub.  No murmur heard. No lower extremity edema  Pulmonary: Non labored breathing on room air, no wheezing or rales  Abdominal: Soft. Normal bowel sounds. Non distended and non tender Musculoskeletal: Normal range of motion.        General: No tenderness or edema.  Neurological: Alert and oriented to person, place, and time. Non focal  Skin: Skin is warm and dry.    Assessment & Plan:   See Encounters Tab for problem based charting.  Patient discussed with Dr. Mayford Knife

## 2022-06-30 NOTE — Assessment & Plan Note (Addendum)
Unclear exact etiology. Currently stable. CMP 08/2017 was WNL. He has never had PT/INR or PTT but he has no signs or symptoms of liver dysfunction. He is not currently on any medications that could be causing his thrombocytopenia. His thrombocytopenia is isolated  At next clinic visit could consider getting CBC and smear to check for platelet clumping. If clumping is seen, recommend getting sample without EDTA (citrate). [place] does not do citrated tubes will check with clinic lab staff to see if [facility] does this. 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 Could consider hematology consult at that time

## 2022-06-30 NOTE — Assessment & Plan Note (Addendum)
Hep C negative.   HIV negative.

## 2022-07-06 ENCOUNTER — Encounter: Payer: Self-pay | Admitting: Student

## 2022-07-06 DIAGNOSIS — E119 Type 2 diabetes mellitus without complications: Secondary | ICD-10-CM

## 2022-07-11 ENCOUNTER — Encounter: Payer: Self-pay | Admitting: *Deleted

## 2022-07-11 NOTE — Progress Notes (Signed)
Internal Medicine Clinic Attending  Case discussed with Dr. Carter  At the time of the visit.  We reviewed the resident's history and exam and pertinent patient test results.  I agree with the assessment, diagnosis, and plan of care documented in the resident's note.  

## 2022-10-14 DIAGNOSIS — H401133 Primary open-angle glaucoma, bilateral, severe stage: Secondary | ICD-10-CM | POA: Diagnosis not present

## 2022-10-21 DIAGNOSIS — H401133 Primary open-angle glaucoma, bilateral, severe stage: Secondary | ICD-10-CM | POA: Diagnosis not present

## 2023-02-10 ENCOUNTER — Other Ambulatory Visit: Payer: Self-pay

## 2023-02-10 DIAGNOSIS — E119 Type 2 diabetes mellitus without complications: Secondary | ICD-10-CM

## 2023-02-10 DIAGNOSIS — E785 Hyperlipidemia, unspecified: Secondary | ICD-10-CM

## 2023-02-13 MED ORDER — ROSUVASTATIN CALCIUM 10 MG PO TABS: ORAL_TABLET | ORAL | 4 refills | Status: AC

## 2023-02-13 MED ORDER — SYNJARDY 5-1000 MG PO TABS: 1.0000 | ORAL_TABLET | Freq: Two times a day (BID) | ORAL | 3 refills | Status: DC

## 2023-02-21 ENCOUNTER — Encounter: Payer: Self-pay | Admitting: Student

## 2023-02-21 ENCOUNTER — Ambulatory Visit (INDEPENDENT_AMBULATORY_CARE_PROVIDER_SITE_OTHER): Payer: No Typology Code available for payment source | Admitting: Student

## 2023-02-21 VITALS — BP 130/77 | HR 59 | Ht 65.0 in | Wt 172.9 lb

## 2023-02-21 DIAGNOSIS — H4010X0 Unspecified open-angle glaucoma, stage unspecified: Secondary | ICD-10-CM | POA: Diagnosis not present

## 2023-02-21 DIAGNOSIS — I10 Essential (primary) hypertension: Secondary | ICD-10-CM

## 2023-02-21 DIAGNOSIS — E785 Hyperlipidemia, unspecified: Secondary | ICD-10-CM | POA: Diagnosis not present

## 2023-02-21 DIAGNOSIS — D696 Thrombocytopenia, unspecified: Secondary | ICD-10-CM

## 2023-02-21 DIAGNOSIS — E119 Type 2 diabetes mellitus without complications: Secondary | ICD-10-CM | POA: Diagnosis not present

## 2023-02-21 DIAGNOSIS — Z7984 Long term (current) use of oral hypoglycemic drugs: Secondary | ICD-10-CM

## 2023-02-21 LAB — POCT GLYCOSYLATED HEMOGLOBIN (HGB A1C): Hemoglobin A1C: 7.1 % — AB (ref 4.0–5.6)

## 2023-02-21 LAB — GLUCOSE, CAPILLARY: Glucose-Capillary: 140 mg/dL — ABNORMAL HIGH (ref 70–99)

## 2023-02-21 MED ORDER — GLUCOSE BLOOD VI STRP: ORAL_STRIP | 12 refills | Status: AC

## 2023-02-21 MED ORDER — BRINZOLAMIDE-BRIMONIDINE 1-0.2 % OP SUSP: 1.0000 [drp] | Freq: Two times a day (BID) | OPHTHALMIC | 1 refills | Status: AC

## 2023-02-21 MED ORDER — LATANOPROST 0.005 % OP SOLN: 1.0000 [drp] | Freq: Every day | OPHTHALMIC | 1 refills | Status: DC

## 2023-02-21 NOTE — Progress Notes (Signed)
 CC: follow-up of chronic medical problems, medication refills  HPI:  [name] is a 63 y.o. male living with a history stated below and presents today for follow-up on HLD, HTN, T2DM, and glaucoma as well as medication refills. Please see problem based assessment and plan for additional details.  Past Medical History:  Diagnosis Date   Glaucoma    History of adenomatous polyp of colon    Hyperlipidemia    Hypertension    Obesity (BMI 30-39.9)    Type 2 diabetes mellitus (HCC)     Current Outpatient Medications on File Prior to Visit  Medication Sig Dispense Refill   Empagliflozin -metFORMIN  HCl (SYNJARDY ) 06-998 MG TABS Take 1 tablet by mouth 2 (two) times daily. 180 tablet 3   glucose blood test strip Use as instructed 100 each 12   Multiple Vitamin (MULTIVITAMIN) capsule Take 1 capsule by mouth daily. 90 capsule 2   rosuvastatin  (CRESTOR ) 10 MG tablet TAKE ONE-HALF TABLET BY MOUTH ONCE A DAY FOR CHOLESTEROL 60 tablet 4   timolol (TIMOPTIC) 0.5 % ophthalmic solution Place into both eyes 2 (two) times daily.     No current facility-administered medications on file prior to visit.    Family History  Problem Relation Age of Onset   Diabetes Father    Hypertension Father    Diabetes Sister    Colon cancer Neg Hx    Esophageal cancer Neg Hx    Rectal cancer Neg Hx    Stomach cancer Neg Hx     Social History   Socioeconomic History   Marital status: Widowed    Spouse name: Not on file   Number of children: Not on file   Years of education: Not on file   Highest education level: Not on file  Occupational History   Not on file  Tobacco Use   Smoking status: Never   Smokeless tobacco: Never  Vaping Use   Vaping status: Never Used  Substance and Sexual Activity   Alcohol use: Yes    Comment: occasonal 1/month   Drug use: Never   Sexual activity: Not on file  Other Topics Concern   Not on file  Social History Narrative   Not on file   Social Drivers of  Health   Financial Resource Strain: Low Risk  (01/18/2022)   Overall Financial Resource Strain (CARDIA)    Difficulty of Paying Living Expenses: Not hard at all  Food Insecurity: No Food Insecurity (01/18/2022)   Hunger Vital Sign    Worried About Running Out of Food in the Last Year: Never true    Ran Out of Food in the Last Year: Never true  Transportation Needs: No Transportation Needs (01/18/2022)   PRAPARE - Administrator, Civil Service (Medical): No    Lack of Transportation (Non-Medical): No  Physical Activity: Not on file  Stress: No Stress Concern Present (01/18/2022)   [employer] of Occupational Health - Occupational Stress Questionnaire    Feeling of Stress : Not at all  Social Connections: Moderately Integrated (01/18/2022)   Social Connection and Isolation Panel [NHANES]    Frequency of Communication with Friends and Family: Twice a week    Frequency of Social Gatherings with Friends and Family: Twice a week    Attends Religious Services: More than 4 times per year    Active Member of [employer] or Organizations: Yes    Attends Banker Meetings: More than 4 times per year    Marital Status:  Widowed  Intimate Partner Violence: Not At Risk (01/18/2022)   Humiliation, Afraid, Rape, and Kick questionnaire    Fear of Current or Ex-Partner: No    Emotionally Abused: No    Physically Abused: No    Sexually Abused: No    Review of Systems: ROS negative except for what is noted on the assessment and plan.  Vitals:   02/21/23 1029  BP: 130/77  Pulse: (!) 59  SpO2: 99%  Weight: 172 lb 14.4 oz (78.4 kg)  Height: 5' 5 (1.651 m)    Physical Exam: Constitutional: well-appearing male in no acute distress HENT: normocephalic atraumatic, mucous membranes moist Eyes: conjunctiva non-erythematous Cardiovascular: regular rate and rhythm, no m/r/g Pulmonary/Chest: normal work of breathing on room air, lungs clear to auscultation  bilaterally Abdominal: soft, non-tender, non-distended MSK: normal bulk and tone Neurological: alert and oriented, no focal deficit Skin: warm and dry Psych: normal mood and behavior  Assessment & Plan:   Patient discussed with Dr. Forest  Unspecified open-angle glaucoma, stage unspecified Patient with new insurance coverage. Told by in-network ophthalmologist, [facility], that he needs PCP referral.  - Placed ophthalmology referral today   Hyperlipidemia Last lipid panel September 2023, LDL 59. Patient taking Crestor /rosuvastatin  10 mg daily.  - Repeat lipid panel today, follow up as needed  Thrombocytopenia (HCC) History of thrombocytopenia without obvious cause. Last CBC May 2024 with platelet count of 112. Will repeat CBC today and follow up as needed.   Type 2 diabetes mellitus without complications (HCC) Hgb A1c 7.1% May 2024. Repeat today 7.1%. Taking Synjardy  06-998 mg BID, tolerating well. CGM with CBGs in range 70.2% of time, average 126. No lows.  - Continue Synjardy  06-998 mg BID  - Microalbumin/Cr ratio today, follow-up as needed  Hypertension BP today 127/74, patient reports daily walks to help with BP and weight management. Not on antihypertensives at this time. Will get labs today and continue to monitor.  - BMP today, follow up as needed  Matthew Noffsinger Arellano Zameza, MD [facility] Internal Medicine, PGY-1 Phone: [phone] Date 02/21/2023 Time 1:34 PM

## 2023-02-21 NOTE — Assessment & Plan Note (Signed)
 Last lipid panel September 2023, LDL 59. Patient taking Crestor/rosuvastatin 10 mg daily.  - Repeat lipid panel today, follow up as needed

## 2023-02-21 NOTE — Patient Instructions (Signed)
 Thank you, [name] for allowing us  to provide your care today. Today we discussed your blood pressure, type 2 diabetes, and medications/referrals.  I have ordered the following labs for you:  Lab Orders         Microalbumin / Creatinine Urine Ratio         Glucose, capillary         Lipid panel         CMP14 + Anion Gap         CBC no Diff         POC Hbg A1C      Tests ordered today:  None  Referrals ordered today:   Referral Orders         Ambulatory referral to Ophthalmology      I have ordered the following medication/changed the following medications:   Stop the following medications: Medications Discontinued During This Encounter  Medication Reason   Brinzolamide -Brimonidine  1-0.2 % SUSP Reorder   latanoprost  (XALATAN ) 0.005 % ophthalmic solution Reorder     Start the following medications: Meds ordered this encounter  Medications   latanoprost  (XALATAN ) 0.005 % ophthalmic solution    Sig: Place 1 drop into both eyes at bedtime.    Dispense:  2.5 mL    Refill:  1   Brinzolamide -Brimonidine  1-0.2 % SUSP    Sig: Place 1 drop into both eyes in the morning and at bedtime.    Dispense:  3 mL    Refill:  1     Follow up: 3-6 months   Remember:   - I will call you with the results of your labs and any follow up as needed.  - Your refills have been sent to your preferred pharmacy.  - Your referral for the eye specialist has been sent. Our staff may call you to make sure Matthew Chase has received it.  - We will see you again in 3-6 months for blood pressure check, diabetes check, and medication management. Please call our office if you need to see us  before that.   Should you have any questions or concerns please call the internal medicine clinic at [phone].     Matthew Som Arellano Zameza, MD PGY-1 Internal Medicine Teaching Progam [facility]

## 2023-02-21 NOTE — Assessment & Plan Note (Addendum)
 History of thrombocytopenia without obvious cause. Last CBC May 2024 with platelet count of 112. Will repeat CBC today and follow up as needed.

## 2023-02-21 NOTE — Assessment & Plan Note (Signed)
 BP today 127/74, patient reports daily walks to help with BP and weight management. Not on antihypertensives at this time. Will get labs today and continue to monitor.  - BMP today, follow up as needed

## 2023-02-21 NOTE — Assessment & Plan Note (Addendum)
 Hgb A1c 7.1% May 2024. Repeat today 7.1%. Taking Synjardy 06-998 mg BID, tolerating well. CGM with CBGs in range 70.2% of time, average 126. No lows.  - Continue Synjardy 06-998 mg BID  - Microalbumin/Cr ratio today, follow-up as needed

## 2023-02-21 NOTE — Assessment & Plan Note (Signed)
 Patient with new insurance coverage. Told by in-network ophthalmologist, [facility], that he needs PCP referral.  - Placed ophthalmology referral today

## 2023-02-22 LAB — CMP14 + ANION GAP
ALT: 18 [IU]/L (ref 0–44)
AST: 18 [IU]/L (ref 0–40)
Albumin: 4.9 g/dL (ref 3.8–4.9)
Alkaline Phosphatase: 54 [IU]/L (ref 44–121)
Anion Gap: 18 mmol/L (ref 10.0–18.0)
BUN/Creatinine Ratio: 8 — ABNORMAL LOW (ref 10–24)
BUN: 7 mg/dL — ABNORMAL LOW (ref 8–27)
Bilirubin Total: 0.7 mg/dL (ref 0.0–1.2)
CO2: 20 mmol/L (ref 20–29)
Calcium: 9.9 mg/dL (ref 8.6–10.2)
Chloride: 104 mmol/L (ref 96–106)
Creatinine, Ser: 0.84 mg/dL (ref 0.76–1.27)
Globulin, Total: 2.4 g/dL (ref 1.5–4.5)
Glucose: 121 mg/dL — ABNORMAL HIGH (ref 70–99)
Potassium: 4.2 mmol/L (ref 3.5–5.2)
Sodium: 142 mmol/L (ref 134–144)
Total Protein: 7.3 g/dL (ref 6.0–8.5)
eGFR: 100 mL/min/{1.73_m2} (ref >59)

## 2023-02-22 LAB — MICROALBUMIN / CREATININE URINE RATIO
Creatinine, Urine: 79.8 mg/dL
Microalb/Creat Ratio: 15 mg/g{creat} (ref 0–29)
Microalbumin, Urine: 11.8 ug/mL

## 2023-02-22 LAB — CBC
Hematocrit: 50.8 % (ref 37.5–51.0)
Hemoglobin: 17.1 g/dL (ref 13.0–17.7)
MCH: 30.9 pg (ref 26.6–33.0)
MCHC: 33.7 g/dL (ref 31.5–35.7)
MCV: 92 fL (ref 79–97)
Platelets: 129 10*3/uL — ABNORMAL LOW (ref 150–450)
RBC: 5.54 x10E6/uL (ref 4.14–5.80)
RDW: 13.6 % (ref 11.6–15.4)
WBC: 3.2 10*3/uL — ABNORMAL LOW (ref 3.4–10.8)

## 2023-02-22 LAB — LIPID PANEL
Chol/HDL Ratio: 2.3 {ratio} (ref 0.0–5.0)
Cholesterol, Total: 108 mg/dL (ref 100–199)
HDL: 48 mg/dL (ref >39)
LDL Chol Calc (NIH): 45 mg/dL (ref 0–99)
Triglycerides: 70 mg/dL (ref 0–149)
VLDL Cholesterol Cal: 15 mg/dL (ref 5–40)

## 2023-02-23 NOTE — Progress Notes (Signed)
 Internal Medicine Clinic Attending  Case discussed with the resident at the time of the visit.  We reviewed the resident's history and exam and pertinent patient test results.  I agree with the assessment, diagnosis, and plan of care documented in the resident's note.

## 2023-04-12 ENCOUNTER — Other Ambulatory Visit: Payer: Self-pay | Admitting: Student

## 2023-04-12 DIAGNOSIS — H4010X0 Unspecified open-angle glaucoma, stage unspecified: Secondary | ICD-10-CM

## 2023-05-18 ENCOUNTER — Ambulatory Visit: Payer: No Typology Code available for payment source | Admitting: Student

## 2023-05-18 VITALS — BP 114/72 | HR 60 | Temp 97.7°F | Ht 65.0 in | Wt 177.7 lb

## 2023-05-18 DIAGNOSIS — E785 Hyperlipidemia, unspecified: Secondary | ICD-10-CM

## 2023-05-18 DIAGNOSIS — I10 Essential (primary) hypertension: Secondary | ICD-10-CM | POA: Diagnosis not present

## 2023-05-18 DIAGNOSIS — E119 Type 2 diabetes mellitus without complications: Secondary | ICD-10-CM | POA: Diagnosis not present

## 2023-05-18 DIAGNOSIS — H4010X0 Unspecified open-angle glaucoma, stage unspecified: Secondary | ICD-10-CM

## 2023-05-18 DIAGNOSIS — Z7984 Long term (current) use of oral hypoglycemic drugs: Secondary | ICD-10-CM

## 2023-05-18 DIAGNOSIS — Z2821 Immunization not carried out because of patient refusal: Secondary | ICD-10-CM | POA: Insufficient documentation

## 2023-05-18 LAB — POCT GLYCOSYLATED HEMOGLOBIN (HGB A1C): Hemoglobin A1C: 7.4 % — AB (ref 4.0–5.6)

## 2023-05-18 LAB — GLUCOSE, CAPILLARY: Glucose-Capillary: 123 mg/dL — ABNORMAL HIGH (ref 70–99)

## 2023-05-18 NOTE — Progress Notes (Signed)
 Established Patient Office Visit  Subjective   Patient ID: Matthew Chase, male    DOB: 04-Jan-1961  Age: 63 y.o. MRN: [id number]  Chief Complaint  Patient presents with   Follow-up    HPI  [name] is a 63 y.o. male living with a history stated below and presents today for follow-up on HLD, HTN, T2DM, and glaucoma after last office visit on 02/21/2023. Please see problem based assessment and plan for additional details   Past Medical History:  Diagnosis Date   Glaucoma    History of adenomatous polyp of colon    Hyperlipidemia    Hypertension    Obesity (BMI 30-39.9)    Type 2 diabetes mellitus (HCC)     ROS   As per Assessment and Plan  Objective:     BP 114/72 (BP Location: Left Arm, Patient Position: Sitting, Cuff Size: Small)   Pulse 60   Temp 97.7 F (36.5 C) (Oral)   Ht 5\' 5"  (1.651 m)   Wt 177 lb 11.2 oz (80.6 kg)   SpO2 98%   BMI 29.57 kg/m  BP Readings from Last 3 Encounters:  05/18/23 114/72  02/21/23 130/77  06/27/22 113/75   Wt Readings from Last 3 Encounters:  05/18/23 177 lb 11.2 oz (80.6 kg)  02/21/23 172 lb 14.4 oz (78.4 kg)  06/27/22 167 lb 8 oz (76 kg)      Physical Exam   General: Sitting in chair, no acute distress Cardiovascular: Regular rate, no murmurs appreciated Pulmonary: Breathing comfortably, no wheezing or crackles Abdomen: Soft, nontender, nondistended, bowel sounds present MSK: Range of motion intact, no lower extremity edema  Results for orders placed or performed in visit on 05/18/23  Glucose, capillary  Result Value Ref Range   Glucose-Capillary 123 (H) 70 - 99 mg/dL  POC Hbg G2X  Result Value Ref Range   Hemoglobin A1C 7.4 (A) 4.0 - 5.6 %   HbA1c POC (<> result, manual entry)     HbA1c, POC (prediabetic range)     HbA1c, POC (controlled diabetic range)      Last CBC Lab Results  Component Value Date   WBC 3.2 (L) 02/21/2023   HGB 17.1 02/21/2023   HCT 50.8 02/21/2023   MCV 92 02/21/2023   MCH 30.9  02/21/2023   RDW 13.6 02/21/2023   PLT 129 (L) 02/21/2023   Last metabolic panel Lab Results  Component Value Date   GLUCOSE 121 (H) 02/21/2023   NA 142 02/21/2023   K 4.2 02/21/2023   CL 104 02/21/2023   CO2 20 02/21/2023   BUN 7 (L) 02/21/2023   CREATININE 0.84 02/21/2023   EGFR 100 02/21/2023   CALCIUM 9.9 02/21/2023   PROT 7.3 02/21/2023   ALBUMIN 4.9 02/21/2023   LABGLOB 2.4 02/21/2023   BILITOT 0.7 02/21/2023   ALKPHOS 54 02/21/2023   AST 18 02/21/2023   ALT 18 02/21/2023   Last lipids Lab Results  Component Value Date   CHOL 108 02/21/2023   HDL 48 02/21/2023   LDLCALC 45 02/21/2023   TRIG 70 02/21/2023   CHOLHDL 2.3 02/21/2023   Last hemoglobin A1c Lab Results  Component Value Date   HGBA1C 7.4 (A) 05/18/2023      The ASCVD Risk score (Arnett DK, et al., 2019) failed to calculate for the following reasons:   The valid total cholesterol range is 130 to 320 mg/dL    Assessment & Plan:  Patient is discussed with: Dr Lafonda Mosses   Problem List  Items Addressed This Visit       Cardiovascular and Mediastinum   Hypertension   BP Readings from Last 3 Encounters:  05/18/23 114/72  02/21/23 130/77  06/27/22 113/75   Patient is not on any antihypertensive medications at this time. Manages BP with lifestyle modifications that consist of daily walks and diet. Labs 02/21/2023 with no electrolyte abnormalities, Scr 0.84, GFR 100. Denies any CP, SOB, LE swelling.   - Continue lifestyle modifications         Endocrine   Type 2 diabetes mellitus without complications (HCC) - Primary   Lab Results  Component Value Date   HGBA1C 7.4 (A) 05/18/2023   Lab Results  Component Value Date   LABMICR 11.8 02/21/2023   LABMICR 4.3 01/18/2022   Last A1c checked on 02/21/2023 with hemoglobin A1c of 7.1; A1c today at 7.4 at goal. Patient takes Synjardy 06-998 mg BID, tolerating well.  Last urine ACR 15, normal.  - Continue Synjardy 06-998 mg BID - Recommend 6 months to  year A1c check  - If A1c continue to increase, can increase Synjardy to the next highest dose        Relevant Orders   POC Hbg A1C (Completed)     Other   Hyperlipidemia   Lab Results  Component Value Date   LDLCALC 45 02/21/2023   Last lipid panel on 02/21/2023 with LDL of 45, denies any previous hx of CVA or PVD   -Continue taking Crestor 10 mg daily      Unspecified open-angle glaucoma, stage unspecified   Follows [facility]hthal[facility], Dr Lottie Dawson.  - dorzolamide-timoloL (COSOPT) x2 OU, follow up 05/29/2023  - latanoprost (XALATAN) 0.005 % ophthalmic solution       Pneumococcal vaccine refused   Per Guidelines's with hx of Diabetes, patient was offered the vaccine, reports that he will do it at the next OV        Return in about 6 months (around 11/17/2023) for A1c and routine .    Jeral Pinch, DO

## 2023-05-18 NOTE — Assessment & Plan Note (Signed)
 Per Guidelines's with hx of Diabetes, patient was offered the vaccine, reports that he will do it at the next OV

## 2023-05-18 NOTE — Assessment & Plan Note (Signed)
 Lab Results  Component Value Date   LDLCALC 45 02/21/2023   Last lipid panel on 02/21/2023 with LDL of 45, denies any previous hx of CVA or PVD   -Continue taking Crestor 10 mg daily

## 2023-05-18 NOTE — Assessment & Plan Note (Signed)
 BP Readings from Last 3 Encounters:  05/18/23 114/72  02/21/23 130/77  06/27/22 113/75   Patient is not on any antihypertensive medications at this time. Manages BP with lifestyle modifications that consist of daily walks and diet. Labs 02/21/2023 with no electrolyte abnormalities, Scr 0.84, GFR 100. Denies any CP, SOB, LE swelling.   - Continue lifestyle modifications

## 2023-05-18 NOTE — Assessment & Plan Note (Signed)
 Follows [facility] - Ophthalmology [place], Dr Lottie Dawson.  - dorzolamide-timoloL (COSOPT) x2 OU, follow up 05/29/2023  - latanoprost (XALATAN) 0.005 % ophthalmic solution

## 2023-05-18 NOTE — Patient Instructions (Addendum)
 Thank you, [name] for allowing [place] to provide your care today. Today we discussed;  Continue taking your medications as prescribed.   No changes were made!  You are doing great taking care of yourself!  I have ordered the following labs for you:  Lab Orders         Glucose, capillary         POC Hbg A1C      Tests ordered today:  None  Referrals ordered today:   Referral Orders  No referral(s) requested today     I have ordered the following medication/changed the following medications:   Stop the following medications: There are no discontinued medications.   Start the following medications: No orders of the defined types were placed in this encounter.    Follow up: 6 months a1c    Remember:   Should you have any questions or concerns please call the internal medicine clinic at [phone].     Jeral Pinch, DO [facility]

## 2023-05-18 NOTE — Assessment & Plan Note (Addendum)
 Lab Results  Component Value Date   HGBA1C 7.4 (A) 05/18/2023   Lab Results  Component Value Date   LABMICR 11.8 02/21/2023   LABMICR 4.3 01/18/2022   Last A1c checked on 02/21/2023 with hemoglobin A1c of 7.1; A1c today at 7.4 at goal. Patient takes Synjardy 06-998 mg BID, tolerating well.  Last urine ACR 15, normal.  - Continue Synjardy 06-998 mg BID - Recommend 6 months to year A1c check  - If A1c continue to increase, can increase Synjardy to the next highest dose

## 2023-05-19 NOTE — Progress Notes (Signed)
 Internal Medicine Clinic Attending  Case discussed with the resident at the time of the visit.  We reviewed the resident's history and exam and pertinent patient test results.  I agree with the assessment, diagnosis, and plan of care documented in the resident's note.

## 2023-07-23 ENCOUNTER — Encounter: Payer: Self-pay | Admitting: *Deleted

## 2023-08-17 ENCOUNTER — Ambulatory Visit: Payer: Self-pay | Admitting: Student

## 2023-08-17 VITALS — BP 129/85 | HR 56 | Ht 65.0 in | Wt 179.5 lb

## 2023-08-17 DIAGNOSIS — E785 Hyperlipidemia, unspecified: Secondary | ICD-10-CM

## 2023-08-17 DIAGNOSIS — Z7984 Long term (current) use of oral hypoglycemic drugs: Secondary | ICD-10-CM

## 2023-08-17 DIAGNOSIS — E119 Type 2 diabetes mellitus without complications: Secondary | ICD-10-CM

## 2023-08-17 DIAGNOSIS — Z23 Encounter for immunization: Secondary | ICD-10-CM | POA: Diagnosis not present

## 2023-08-17 LAB — POCT GLYCOSYLATED HEMOGLOBIN (HGB A1C): Hemoglobin A1C: 7.8 % — AB (ref 4.0–5.6)

## 2023-08-17 LAB — GLUCOSE, CAPILLARY: Glucose-Capillary: 117 mg/dL — ABNORMAL HIGH (ref 70–99)

## 2023-08-17 MED ORDER — SYNJARDY 12.5-1000 MG PO TABS: 1.0000 | ORAL_TABLET | Freq: Two times a day (BID) | ORAL | 3 refills | Status: AC

## 2023-08-17 NOTE — Patient Instructions (Signed)
 Thank you, [name] for allowing us  to provide your care today. Today we discussed.  - Start SynJardy  12.06-998 mg, one tablet by mouth twice daily - Continue Crestor  10 mg daily  Please watch your carbs and sweets.  I have ordered the following labs for you:   Lab Orders         Glucose, capillary         POC Hbg A1C      Tests ordered today:  None  Referrals ordered today:   Referral Orders  No referral(s) requested today     I have ordered the following medication/changed the following medications:   Stop the following medications: Medications Discontinued During This Encounter  Medication Reason   Empagliflozin -metFORMIN  HCl (SYNJARDY ) 06-998 MG TABS      Start the following medications: Meds ordered this encounter  Medications   Empagliflozin -metFORMIN  HCl (SYNJARDY ) 12.06-998 MG TABS    Sig: Take 1 tablet by mouth in the morning and at bedtime.    Dispense:  180 tablet    Refill:  3     Follow up: 3 months A1c   Remember:   Should you have any questions or concerns please call the internal medicine clinic at [phone].     Rayann Atway, D.O. [facility]

## 2023-08-17 NOTE — Progress Notes (Signed)
 Established Patient Office Visit  Subjective   Patient ID: Matthew Chase, male    DOB: 12/14/1960  Age: 63 y.o. MRN: [id number]  Chief Complaint  Patient presents with   Medical Management of Chronic Issues    Routine follow up chronic problems     HPI  This is a 63 year old Male with past medical history of unspecified open angle glaucoma, hyperlipidemia, thrombocytopenia, type 2 diabetes without complication, hypertension presents today for diabetes follow-up.  Last office visit on 02/21/2023.  ROS   As per assessment and plan Objective:     BP 129/85 (BP Location: Left Arm, Patient Position: Sitting, Cuff Size: Normal)   Pulse (!) 56   Ht 5' 5 (1.651 m)   Wt 179 lb 8 oz (81.4 kg)   SpO2 98%   BMI 29.87 kg/m  BP Readings from Last 3 Encounters:  08/17/23 129/85  05/18/23 114/72  02/21/23 130/77   Wt Readings from Last 3 Encounters:  08/17/23 179 lb 8 oz (81.4 kg)  05/18/23 177 lb 11.2 oz (80.6 kg)  02/21/23 172 lb 14.4 oz (78.4 kg)      Physical Exam  General: Sitting in chair, no acute distress Cardiovascular: Regular rate, no murmurs appreciated Pulmonary: Breathing comfortably, no wheezing or crackles Abdomen: Soft, nontender, nondistended, bowel sounds present MSK: Range of motion intact, no lower extremity edema  Results for orders placed or performed in visit on 08/17/23  Glucose, capillary  Result Value Ref Range   Glucose-Capillary 117 (H) 70 - 99 mg/dL  POC Hbg J8R  Result Value Ref Range   Hemoglobin A1C 7.8 (A) 4.0 - 5.6 %   HbA1c POC (<> result, manual entry)     HbA1c, POC (prediabetic range)     HbA1c, POC (controlled diabetic range)      Last metabolic panel Lab Results  Component Value Date   GLUCOSE 121 (H) 02/21/2023   NA 142 02/21/2023   K 4.2 02/21/2023   CL 104 02/21/2023   CO2 20 02/21/2023   BUN 7 (L) 02/21/2023   CREATININE 0.84 02/21/2023   EGFR 100 02/21/2023   CALCIUM  9.9 02/21/2023   PROT 7.3 02/21/2023    ALBUMIN 4.9 02/21/2023   LABGLOB 2.4 02/21/2023   BILITOT 0.7 02/21/2023   ALKPHOS 54 02/21/2023   AST 18 02/21/2023   ALT 18 02/21/2023   Last lipids Lab Results  Component Value Date   CHOL 108 02/21/2023   HDL 48 02/21/2023   LDLCALC 45 02/21/2023   TRIG 70 02/21/2023   CHOLHDL 2.3 02/21/2023   Last hemoglobin A1c Lab Results  Component Value Date   HGBA1C 7.8 (A) 08/17/2023     Lab Results  Component Value Date   LABMICR 11.8 02/21/2023   LABMICR 4.3 01/18/2022     The ASCVD Risk score (Arnett DK, et al., 2019) failed to calculate for the following reasons:   The valid total cholesterol range is 130 to 320 mg/dL    Assessment & Plan:  Patient is discussed with Dr. Trudy  Problem List Items Addressed This Visit       Endocrine   Type 2 diabetes mellitus without complications (HCC) - Primary   Last A1c 7.4, checked on 05/18/2023.; A1c today 7.8. Patient is currently taking Synjardy  06-998 mg twice daily, tolerating well. FSBG 130-135; no hypoglycemic events. Denies any symptoms of dizziness, lightheadedness, chest pain or shortness of breath.  Denies any symptoms of polyuria and polydipsia. Reports eating more carbs and sweets,  but will watch diet..  - Foot exam in office today - Increase Synjardy  to 12.06-998 mg twice daily -Will recheck A1c in 3 months      Relevant Medications   Empagliflozin -metFORMIN  HCl (SYNJARDY ) 12.06-998 MG TABS   Other Relevant Orders   POC Hbg A1C (Completed)     Other   Hyperlipidemia   LDL 45, checked on 02/21/2023. Primary prevention. Patient is currently taking Crestor  10 mg daily. - Continue Crestor  10 mg daily      Pneumococcal vaccine administered   Pneumococcal vaccine administered today.      Other Visit Diagnoses       Encounter for immunization       Relevant Orders   Pneumococcal conjugate vaccine 20-valent (Completed)       Return in about 3 months (around 11/17/2023) for A1c.    Toma Edwards,  DO

## 2023-08-18 DIAGNOSIS — Z23 Encounter for immunization: Secondary | ICD-10-CM | POA: Insufficient documentation

## 2023-08-18 NOTE — Assessment & Plan Note (Signed)
 LDL 45, checked on 02/21/2023. Primary prevention. Patient is currently taking Crestor  10 mg daily. - Continue Crestor  10 mg daily

## 2023-08-18 NOTE — Assessment & Plan Note (Addendum)
 SABRA

## 2023-08-18 NOTE — Assessment & Plan Note (Signed)
Pneumococcal vaccine administered today. °

## 2023-08-18 NOTE — Assessment & Plan Note (Signed)
 Last A1c 7.4, checked on 05/18/2023.; A1c today 7.8. Patient is currently taking Synjardy  06-998 mg twice daily, tolerating well. FSBG 130-135; no hypoglycemic events. Denies any symptoms of dizziness, lightheadedness, chest pain or shortness of breath.  Denies any symptoms of polyuria and polydipsia. Reports eating more carbs and sweets, but will watch diet..  - Foot exam in office today - Increase Synjardy  to 12.06-998 mg twice daily -Will recheck A1c in 3 months

## 2023-09-04 NOTE — Progress Notes (Signed)
 Internal Medicine Clinic Attending  Case discussed with the resident at the time of the visit.  We reviewed the resident's history and exam and pertinent patient test results.  I agree with the assessment, diagnosis, and plan of care documented in the resident's note.

## 2023-12-04 ENCOUNTER — Ambulatory Visit: Payer: Self-pay | Admitting: Student
